# Patient Record
Sex: Female | Born: 1985 | Race: Black or African American | Hispanic: No | Marital: Single | Smoking: Never smoker
Health system: Southern US, Community
[De-identification: ages and names within clinical notes are randomized; demographics above are authoritative.]

## PROBLEM LIST (undated history)

## (undated) DIAGNOSIS — G5601 Carpal tunnel syndrome, right upper limb: Secondary | ICD-10-CM

## (undated) DIAGNOSIS — R011 Cardiac murmur, unspecified: Secondary | ICD-10-CM

## (undated) DIAGNOSIS — J40 Bronchitis, not specified as acute or chronic: Secondary | ICD-10-CM

## (undated) DIAGNOSIS — J45909 Unspecified asthma, uncomplicated: Secondary | ICD-10-CM

## (undated) HISTORY — PX: TUBAL LIGATION: SHX77

---
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
## 2005-01-01 ENCOUNTER — Inpatient Hospital Stay (HOSPITAL_COMMUNITY): Admission: AD | Admit: 2005-01-01 | Discharge: 2005-01-01 | Payer: Self-pay | Admitting: Obstetrics

## 2005-01-13 ENCOUNTER — Emergency Department (HOSPITAL_COMMUNITY): Admission: EM | Admit: 2005-01-13 | Discharge: 2005-01-13 | Payer: Self-pay | Admitting: Emergency Medicine

## 2005-01-16 ENCOUNTER — Inpatient Hospital Stay (HOSPITAL_COMMUNITY): Admission: AD | Admit: 2005-01-16 | Discharge: 2005-01-16 | Payer: Self-pay | Admitting: Obstetrics & Gynecology

## 2005-01-21 ENCOUNTER — Ambulatory Visit (HOSPITAL_COMMUNITY): Admission: RE | Admit: 2005-01-21 | Discharge: 2005-01-21 | Payer: Self-pay | Admitting: Obstetrics

## 2005-02-23 ENCOUNTER — Ambulatory Visit (HOSPITAL_COMMUNITY): Admission: RE | Admit: 2005-02-23 | Discharge: 2005-02-23 | Payer: Self-pay | Admitting: Obstetrics & Gynecology

## 2005-02-23 ENCOUNTER — Emergency Department (HOSPITAL_COMMUNITY): Admission: EM | Admit: 2005-02-23 | Discharge: 2005-02-23 | Payer: Self-pay | Admitting: Emergency Medicine

## 2005-02-26 ENCOUNTER — Encounter: Admission: RE | Admit: 2005-02-26 | Discharge: 2005-03-24 | Payer: Self-pay | Admitting: Obstetrics & Gynecology

## 2005-03-15 ENCOUNTER — Inpatient Hospital Stay (HOSPITAL_COMMUNITY): Admission: AD | Admit: 2005-03-15 | Discharge: 2005-03-15 | Payer: Self-pay | Admitting: Obstetrics & Gynecology

## 2005-03-31 ENCOUNTER — Inpatient Hospital Stay (HOSPITAL_COMMUNITY): Admission: AD | Admit: 2005-03-31 | Discharge: 2005-03-31 | Payer: Self-pay | Admitting: Obstetrics & Gynecology

## 2005-04-26 ENCOUNTER — Inpatient Hospital Stay (HOSPITAL_COMMUNITY): Admission: AD | Admit: 2005-04-26 | Discharge: 2005-04-26 | Payer: Self-pay | Admitting: Obstetrics

## 2005-05-02 ENCOUNTER — Inpatient Hospital Stay (HOSPITAL_COMMUNITY): Admission: AD | Admit: 2005-05-02 | Discharge: 2005-05-02 | Payer: Self-pay | Admitting: Obstetrics

## 2005-05-28 ENCOUNTER — Inpatient Hospital Stay (HOSPITAL_COMMUNITY): Admission: AD | Admit: 2005-05-28 | Discharge: 2005-05-28 | Payer: Self-pay | Admitting: Obstetrics & Gynecology

## 2005-06-01 ENCOUNTER — Inpatient Hospital Stay (HOSPITAL_COMMUNITY): Admission: AD | Admit: 2005-06-01 | Discharge: 2005-06-03 | Payer: Self-pay | Admitting: Obstetrics

## 2005-06-10 ENCOUNTER — Encounter: Admission: RE | Admit: 2005-06-10 | Discharge: 2005-06-10 | Payer: Self-pay | Admitting: Obstetrics & Gynecology

## 2005-06-23 ENCOUNTER — Emergency Department (HOSPITAL_COMMUNITY): Admission: EM | Admit: 2005-06-23 | Discharge: 2005-06-23 | Payer: Self-pay | Admitting: Emergency Medicine

## 2005-08-04 ENCOUNTER — Emergency Department (HOSPITAL_COMMUNITY): Admission: EM | Admit: 2005-08-04 | Discharge: 2005-08-04 | Payer: Self-pay | Admitting: Emergency Medicine

## 2005-10-04 ENCOUNTER — Emergency Department (HOSPITAL_COMMUNITY): Admission: EM | Admit: 2005-10-04 | Discharge: 2005-10-04 | Payer: Self-pay | Admitting: Emergency Medicine

## 2005-10-08 ENCOUNTER — Emergency Department (HOSPITAL_COMMUNITY): Admission: EM | Admit: 2005-10-08 | Discharge: 2005-10-08 | Payer: Self-pay | Admitting: Emergency Medicine

## 2005-11-18 ENCOUNTER — Inpatient Hospital Stay (HOSPITAL_COMMUNITY): Admission: AD | Admit: 2005-11-18 | Discharge: 2005-11-18 | Payer: Self-pay | Admitting: Family Medicine

## 2005-12-16 ENCOUNTER — Emergency Department (HOSPITAL_COMMUNITY): Admission: EM | Admit: 2005-12-16 | Discharge: 2005-12-16 | Payer: Self-pay | Admitting: Emergency Medicine

## 2005-12-21 ENCOUNTER — Ambulatory Visit: Payer: Self-pay | Admitting: Certified Nurse Midwife

## 2005-12-21 ENCOUNTER — Ambulatory Visit: Payer: Self-pay | Admitting: Obstetrics and Gynecology

## 2005-12-21 ENCOUNTER — Inpatient Hospital Stay (HOSPITAL_COMMUNITY): Admission: AD | Admit: 2005-12-21 | Discharge: 2005-12-24 | Payer: Self-pay | Admitting: Obstetrics and Gynecology

## 2005-12-23 ENCOUNTER — Ambulatory Visit: Payer: Self-pay | Admitting: Neonatology

## 2005-12-25 ENCOUNTER — Inpatient Hospital Stay (HOSPITAL_COMMUNITY): Admission: AD | Admit: 2005-12-25 | Discharge: 2005-12-26 | Payer: Self-pay | Admitting: Family Medicine

## 2005-12-27 ENCOUNTER — Ambulatory Visit: Payer: Self-pay | Admitting: Obstetrics & Gynecology

## 2006-01-04 ENCOUNTER — Inpatient Hospital Stay (HOSPITAL_COMMUNITY): Admission: AD | Admit: 2006-01-04 | Discharge: 2006-01-04 | Payer: Self-pay | Admitting: Obstetrics and Gynecology

## 2006-01-04 ENCOUNTER — Ambulatory Visit: Payer: Self-pay | Admitting: Obstetrics and Gynecology

## 2006-01-10 ENCOUNTER — Ambulatory Visit: Payer: Self-pay | Admitting: *Deleted

## 2006-01-21 ENCOUNTER — Inpatient Hospital Stay (HOSPITAL_COMMUNITY): Admission: AD | Admit: 2006-01-21 | Discharge: 2006-01-21 | Payer: Self-pay | Admitting: Obstetrics & Gynecology

## 2006-01-24 ENCOUNTER — Ambulatory Visit: Payer: Self-pay | Admitting: Obstetrics & Gynecology

## 2006-01-31 ENCOUNTER — Ambulatory Visit: Payer: Self-pay | Admitting: Obstetrics & Gynecology

## 2006-01-31 ENCOUNTER — Ambulatory Visit (HOSPITAL_COMMUNITY): Admission: RE | Admit: 2006-01-31 | Discharge: 2006-01-31 | Payer: Self-pay | Admitting: Obstetrics & Gynecology

## 2006-02-07 ENCOUNTER — Ambulatory Visit: Payer: Self-pay | Admitting: *Deleted

## 2006-02-28 ENCOUNTER — Ambulatory Visit: Payer: Self-pay | Admitting: Family Medicine

## 2006-03-06 ENCOUNTER — Inpatient Hospital Stay (HOSPITAL_COMMUNITY): Admission: AD | Admit: 2006-03-06 | Discharge: 2006-03-06 | Payer: Self-pay | Admitting: Obstetrics and Gynecology

## 2006-03-18 ENCOUNTER — Inpatient Hospital Stay (HOSPITAL_COMMUNITY): Admission: AD | Admit: 2006-03-18 | Discharge: 2006-03-19 | Payer: Self-pay | Admitting: Obstetrics and Gynecology

## 2006-03-18 ENCOUNTER — Ambulatory Visit: Payer: Self-pay | Admitting: Obstetrics and Gynecology

## 2006-04-06 ENCOUNTER — Ambulatory Visit: Payer: Self-pay | Admitting: Certified Nurse Midwife

## 2006-04-06 ENCOUNTER — Observation Stay (HOSPITAL_COMMUNITY): Admission: AD | Admit: 2006-04-06 | Discharge: 2006-04-07 | Payer: Self-pay | Admitting: Family Medicine

## 2006-04-23 ENCOUNTER — Ambulatory Visit: Payer: Self-pay | Admitting: *Deleted

## 2006-04-23 ENCOUNTER — Inpatient Hospital Stay (HOSPITAL_COMMUNITY): Admission: AD | Admit: 2006-04-23 | Discharge: 2006-04-23 | Payer: Self-pay | Admitting: Family Medicine

## 2006-04-25 ENCOUNTER — Inpatient Hospital Stay (HOSPITAL_COMMUNITY): Admission: AD | Admit: 2006-04-25 | Discharge: 2006-04-25 | Payer: Self-pay | Admitting: Gynecology

## 2006-04-25 ENCOUNTER — Ambulatory Visit: Payer: Self-pay | Admitting: Obstetrics and Gynecology

## 2006-04-26 ENCOUNTER — Inpatient Hospital Stay (HOSPITAL_COMMUNITY): Admission: AD | Admit: 2006-04-26 | Discharge: 2006-04-26 | Payer: Self-pay | Admitting: Obstetrics and Gynecology

## 2006-04-26 ENCOUNTER — Ambulatory Visit: Payer: Self-pay | Admitting: *Deleted

## 2006-05-05 ENCOUNTER — Inpatient Hospital Stay (HOSPITAL_COMMUNITY): Admission: AD | Admit: 2006-05-05 | Discharge: 2006-05-07 | Payer: Self-pay | Admitting: Obstetrics and Gynecology

## 2006-05-05 ENCOUNTER — Ambulatory Visit: Payer: Self-pay | Admitting: Obstetrics and Gynecology

## 2006-08-31 ENCOUNTER — Emergency Department (HOSPITAL_COMMUNITY): Admission: EM | Admit: 2006-08-31 | Discharge: 2006-08-31 | Payer: Self-pay | Admitting: Emergency Medicine

## 2006-11-29 ENCOUNTER — Emergency Department (HOSPITAL_COMMUNITY): Admission: EM | Admit: 2006-11-29 | Discharge: 2006-11-29 | Payer: Self-pay | Admitting: Emergency Medicine

## 2007-01-23 ENCOUNTER — Emergency Department (HOSPITAL_COMMUNITY): Admission: EM | Admit: 2007-01-23 | Discharge: 2007-01-23 | Payer: Self-pay | Admitting: Emergency Medicine

## 2007-01-25 ENCOUNTER — Ambulatory Visit: Payer: Self-pay | Admitting: Obstetrics and Gynecology

## 2007-02-28 ENCOUNTER — Ambulatory Visit (HOSPITAL_COMMUNITY): Admission: RE | Admit: 2007-02-28 | Discharge: 2007-02-28 | Payer: Self-pay | Admitting: Obstetrics & Gynecology

## 2007-02-28 ENCOUNTER — Ambulatory Visit: Payer: Self-pay | Admitting: Obstetrics & Gynecology

## 2007-03-01 ENCOUNTER — Inpatient Hospital Stay (HOSPITAL_COMMUNITY): Admission: AD | Admit: 2007-03-01 | Discharge: 2007-03-01 | Payer: Self-pay | Admitting: Family Medicine

## 2007-03-14 ENCOUNTER — Emergency Department (HOSPITAL_COMMUNITY): Admission: EM | Admit: 2007-03-14 | Discharge: 2007-03-14 | Payer: Self-pay | Admitting: Emergency Medicine

## 2007-03-18 ENCOUNTER — Emergency Department (HOSPITAL_COMMUNITY): Admission: EM | Admit: 2007-03-18 | Discharge: 2007-03-18 | Payer: Self-pay | Admitting: Emergency Medicine

## 2007-06-15 ENCOUNTER — Emergency Department (HOSPITAL_COMMUNITY): Admission: EM | Admit: 2007-06-15 | Discharge: 2007-06-15 | Payer: Self-pay | Admitting: Emergency Medicine

## 2007-06-28 ENCOUNTER — Emergency Department (HOSPITAL_COMMUNITY): Admission: EM | Admit: 2007-06-28 | Discharge: 2007-06-29 | Payer: Self-pay | Admitting: Emergency Medicine

## 2007-08-07 ENCOUNTER — Inpatient Hospital Stay (HOSPITAL_COMMUNITY): Admission: AD | Admit: 2007-08-07 | Discharge: 2007-08-07 | Payer: Self-pay | Admitting: Obstetrics & Gynecology

## 2007-09-26 ENCOUNTER — Emergency Department (HOSPITAL_COMMUNITY): Admission: EM | Admit: 2007-09-26 | Discharge: 2007-09-26 | Payer: Self-pay | Admitting: Emergency Medicine

## 2007-09-29 ENCOUNTER — Emergency Department (HOSPITAL_COMMUNITY): Admission: EM | Admit: 2007-09-29 | Discharge: 2007-09-29 | Payer: Self-pay | Admitting: Emergency Medicine

## 2007-10-09 ENCOUNTER — Emergency Department (HOSPITAL_COMMUNITY): Admission: EM | Admit: 2007-10-09 | Discharge: 2007-10-09 | Payer: Self-pay | Admitting: Emergency Medicine

## 2007-10-20 ENCOUNTER — Emergency Department (HOSPITAL_COMMUNITY): Admission: EM | Admit: 2007-10-20 | Discharge: 2007-10-20 | Payer: Self-pay | Admitting: Emergency Medicine

## 2007-11-21 ENCOUNTER — Inpatient Hospital Stay (HOSPITAL_COMMUNITY): Admission: AD | Admit: 2007-11-21 | Discharge: 2007-11-21 | Payer: Self-pay | Admitting: Obstetrics & Gynecology

## 2007-12-11 ENCOUNTER — Emergency Department (HOSPITAL_COMMUNITY): Admission: EM | Admit: 2007-12-11 | Discharge: 2007-12-11 | Payer: Self-pay | Admitting: Emergency Medicine

## 2007-12-22 ENCOUNTER — Emergency Department (HOSPITAL_COMMUNITY): Admission: EM | Admit: 2007-12-22 | Discharge: 2007-12-22 | Payer: Self-pay | Admitting: Emergency Medicine

## 2008-01-09 ENCOUNTER — Emergency Department (HOSPITAL_COMMUNITY): Admission: EM | Admit: 2008-01-09 | Discharge: 2008-01-09 | Payer: Self-pay | Admitting: Emergency Medicine

## 2008-02-04 ENCOUNTER — Inpatient Hospital Stay (HOSPITAL_COMMUNITY): Admission: AD | Admit: 2008-02-04 | Discharge: 2008-02-04 | Payer: Self-pay | Admitting: Gynecology

## 2008-03-24 ENCOUNTER — Emergency Department (HOSPITAL_COMMUNITY): Admission: EM | Admit: 2008-03-24 | Discharge: 2008-03-24 | Payer: Self-pay | Admitting: Emergency Medicine

## 2008-04-14 ENCOUNTER — Emergency Department (HOSPITAL_COMMUNITY): Admission: EM | Admit: 2008-04-14 | Discharge: 2008-04-14 | Payer: Self-pay | Admitting: Emergency Medicine

## 2008-04-18 ENCOUNTER — Emergency Department (HOSPITAL_COMMUNITY): Admission: EM | Admit: 2008-04-18 | Discharge: 2008-04-18 | Payer: Self-pay | Admitting: Emergency Medicine

## 2008-06-03 ENCOUNTER — Inpatient Hospital Stay (HOSPITAL_COMMUNITY): Admission: AD | Admit: 2008-06-03 | Discharge: 2008-06-03 | Payer: Self-pay | Admitting: Obstetrics & Gynecology

## 2008-07-30 ENCOUNTER — Emergency Department (HOSPITAL_COMMUNITY): Admission: EM | Admit: 2008-07-30 | Discharge: 2008-07-30 | Payer: Self-pay | Admitting: Emergency Medicine

## 2008-09-07 IMAGING — CT CT PELVIS W/ CM
3 of 5 series · 14 of 32 positions shown, 19 images · IV contrast (omnipaque)
Comparison: none

CLINICAL DATA: Right-sided abdominal and pelvic pain.  Nausea/vomiting.
 ABDOMEN CT WITH CONTRAST:
TECHNIQUE: Multidetector CT imaging of the abdomen was performed following the standard protocol during bolus administration of intravenous contrast.
 Contrast:  100 cc Omnipaque 300 and oral contrast.
TECHNIQUE: Multidetector CT imaging of the pelvis was performed following the standard protocol during bolus administration of intravenous contrast.

[Series 2: routine abdomen · axial · 0.70mm/px · z∈[-389,-144]mm · 4 of 83 slices shown, 9 images]
[im 17/83  soft-tissue]
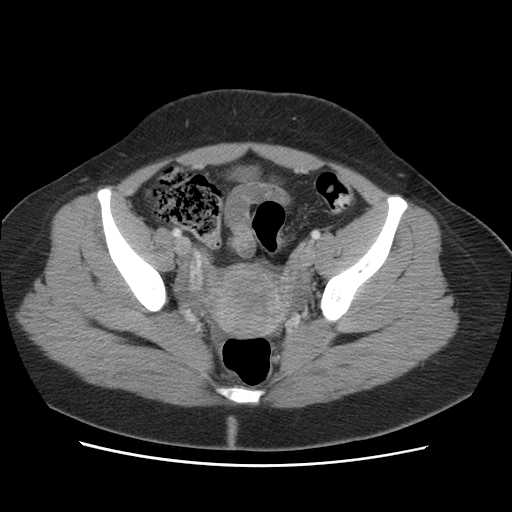
[im 17/83  lung]
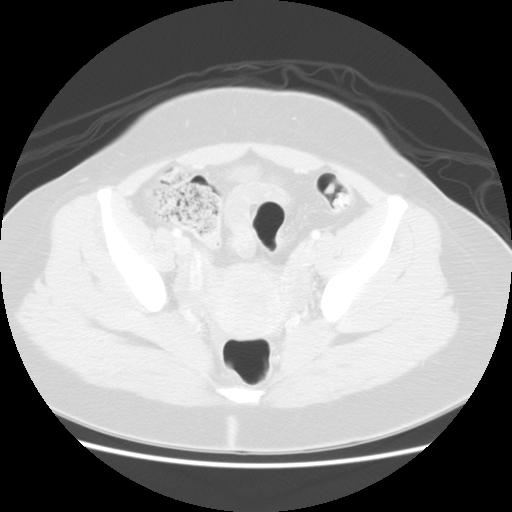
[im 17/83  bone]
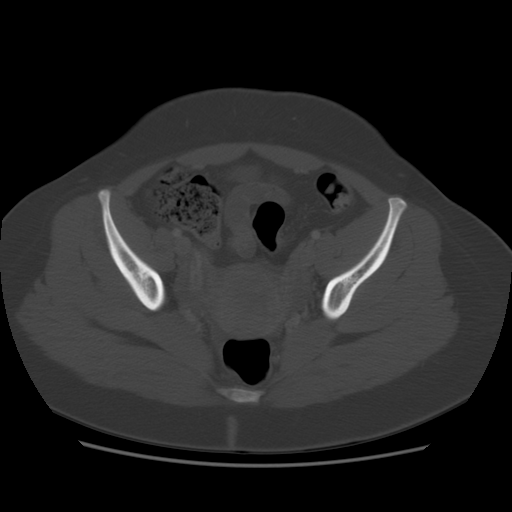
[im 33/83  soft-tissue]
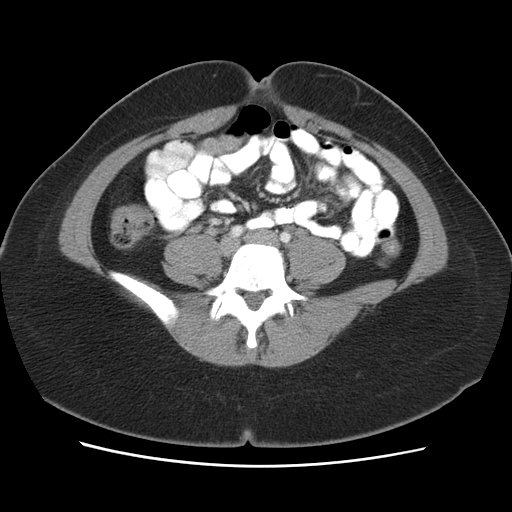
[im 33/83  lung]
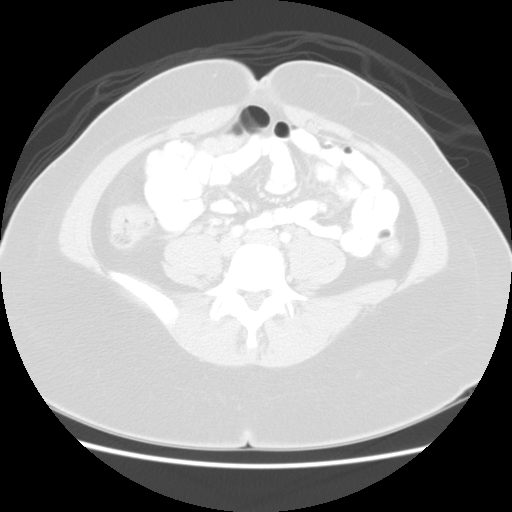
[im 50/83  soft-tissue]
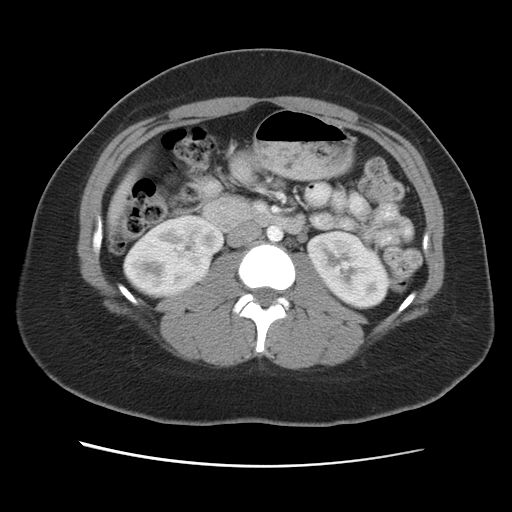
[im 50/83  lung]
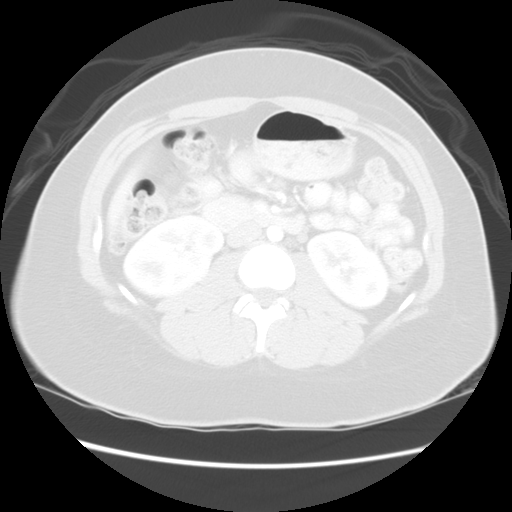
[im 66/83  soft-tissue]
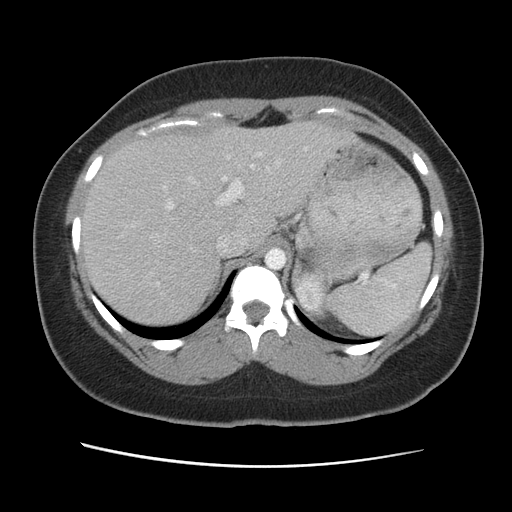
[im 66/83  lung]
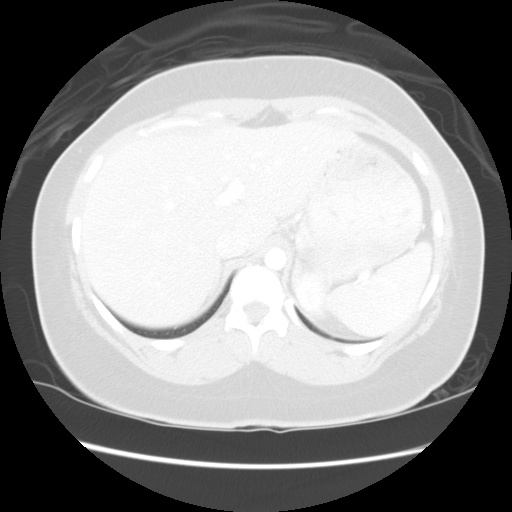

[Series 400: reformatted · sagittal · 0.88mm/px · 8 of 168 slices shown (1 of 2)]
[im 16/168  soft-tissue]
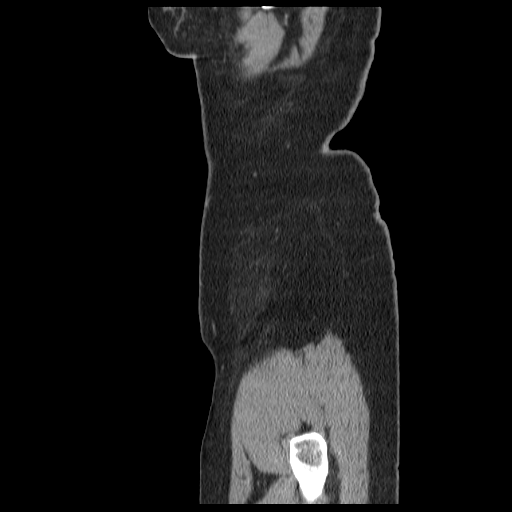
[im 31/168  soft-tissue]
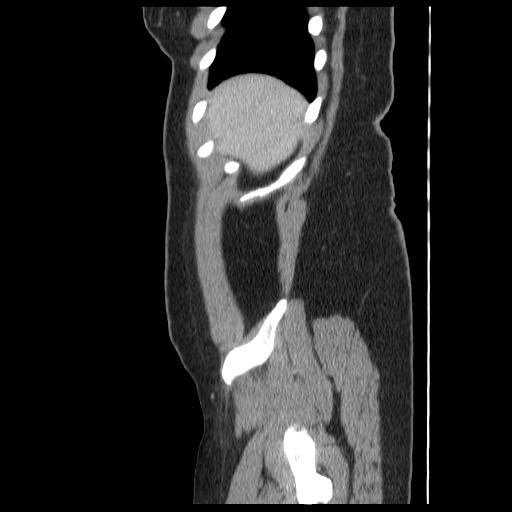
[im 61/168  soft-tissue]
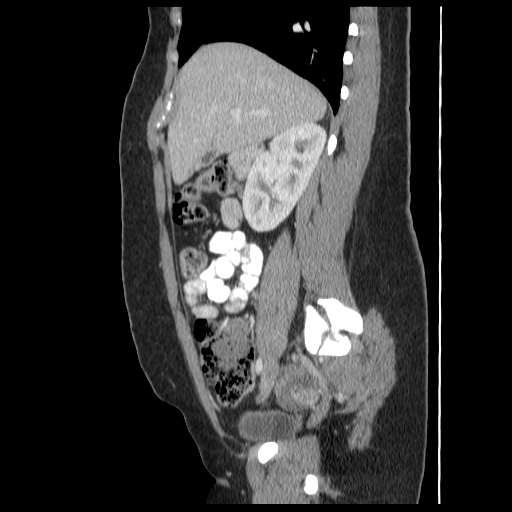
[im 76/168  soft-tissue]
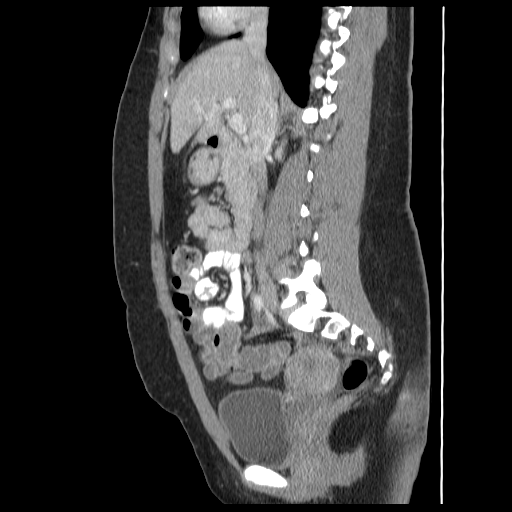
[im 92/168  soft-tissue]
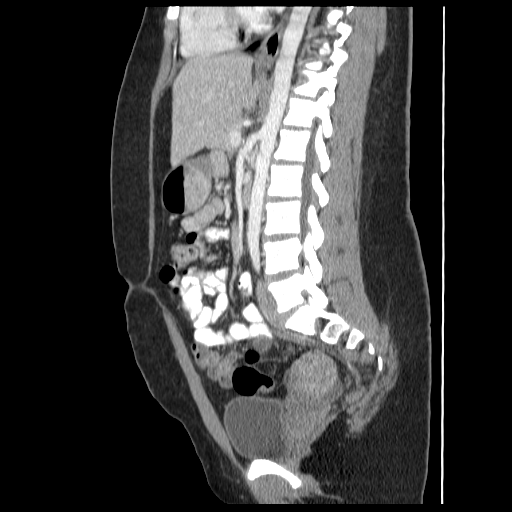
[im 107/168  soft-tissue]
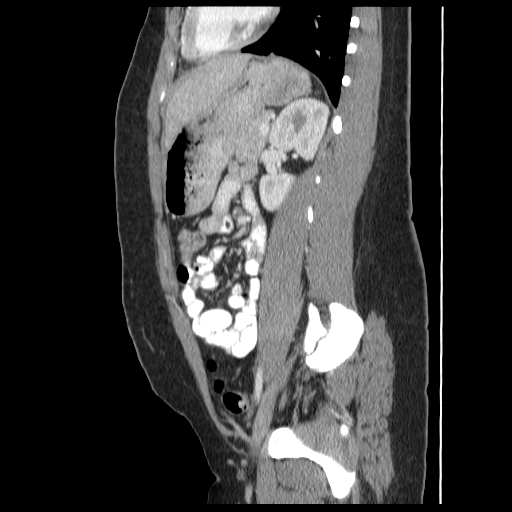
[im 137/168  soft-tissue]
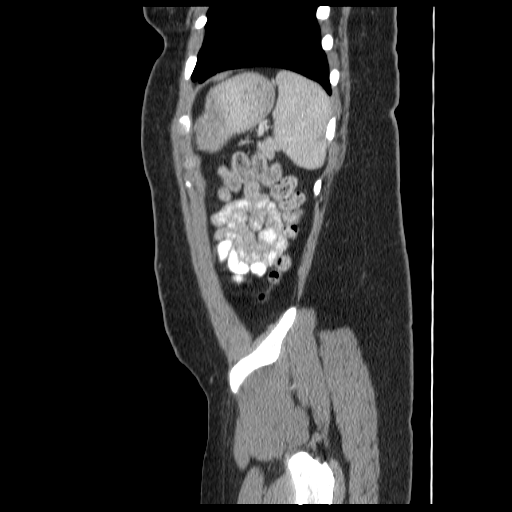
[im 152/168  soft-tissue]
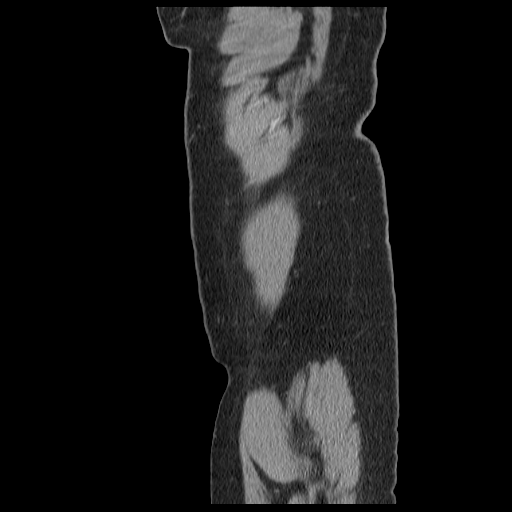

[Series 401: reformatted · coronal · 0.88mm/px · 2 of 140 slices shown (2 of 2)]
[im 16/140  soft-tissue]
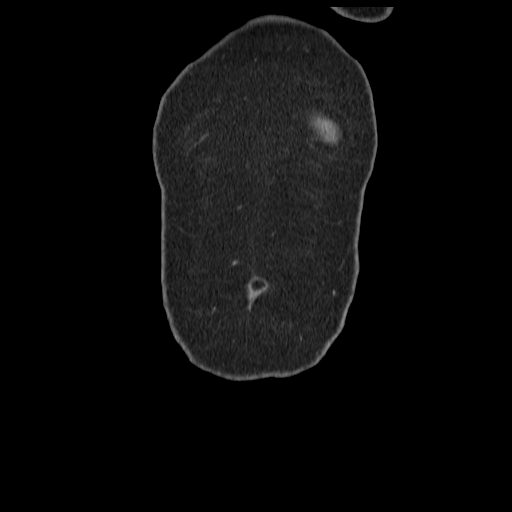
[im 31/140  soft-tissue]
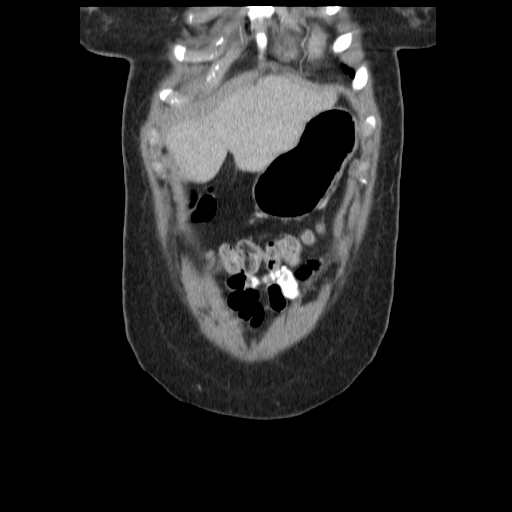

[14 of 32 positions shown; findings below may reference images not displayed]

FINDINGS: The abdominal parenchymal organs are normal in appearance.  A 4 mm calculus is noted in the lower pole of the right kidney, however there is no evidence of hydronephrosis.  There is no evidence of mass or lymphadenopathy within the abdomen.  There is no evidence of inflammatory process or abnormal fluid collections.
IMPRESSION: 1.  No acute findings.
 2.  4 mm right lower pole intrarenal calculus.  No evidence of hydronephrosis.
 PELVIS CT WITH CONTRAST:
FINDINGS: There is no evidence of pelvic mass or adenopathy.  There is no evidence of inflammatory process.  Normal appendix is noted.  There is a tiny amount of free fluid in the pelvic cul-de-sac which is nonspecific but likely physiologic.
IMPRESSION: 1.  Tiny amount of free fluid in the pelvic cul-de-sac which is nonspecific but likely physiologic.
 2.  No evidence of mass or inflammatory process.

## 2009-06-14 ENCOUNTER — Emergency Department (HOSPITAL_COMMUNITY): Admission: EM | Admit: 2009-06-14 | Discharge: 2009-06-14 | Payer: Self-pay | Admitting: Emergency Medicine

## 2009-11-22 ENCOUNTER — Emergency Department (HOSPITAL_COMMUNITY): Admission: EM | Admit: 2009-11-22 | Discharge: 2009-11-22 | Payer: Self-pay | Admitting: Emergency Medicine

## 2010-08-02 ENCOUNTER — Encounter: Payer: Self-pay | Admitting: Obstetrics & Gynecology

## 2010-08-02 ENCOUNTER — Encounter: Payer: Self-pay | Admitting: Otolaryngology

## 2010-09-29 LAB — COMPREHENSIVE METABOLIC PANEL
Albumin: 3.8 g/dL (ref 3.5–5.2)
BUN: 7 mg/dL (ref 6–23)
CO2: 24 mEq/L (ref 19–32)
Calcium: 8.7 mg/dL (ref 8.4–10.5)
Creatinine, Ser: 0.66 mg/dL (ref 0.4–1.2)
GFR calc Af Amer: >60 mL/min (ref >60)
GFR calc non Af Amer: >60 mL/min (ref >60)
Glucose, Bld: 87 mg/dL (ref 70–99)

## 2010-09-29 LAB — URINALYSIS, ROUTINE W REFLEX MICROSCOPIC
Glucose, UA: NEGATIVE mg/dL
Ketones, ur: NEGATIVE mg/dL
Leukocytes, UA: NEGATIVE
Nitrite: NEGATIVE
Specific Gravity, Urine: 1.035 — ABNORMAL HIGH (ref 1.005–1.030)
Urobilinogen, UA: 0.2 mg/dL (ref 0.0–1.0)

## 2010-09-29 LAB — POCT PREGNANCY, URINE: Preg Test, Ur: NEGATIVE

## 2010-10-13 LAB — URINALYSIS, ROUTINE W REFLEX MICROSCOPIC
Bilirubin Urine: NEGATIVE
Leukocytes, UA: NEGATIVE
Nitrite: NEGATIVE
Protein, ur: NEGATIVE mg/dL
Specific Gravity, Urine: 1.025 (ref 1.005–1.030)

## 2010-10-13 LAB — POCT PREGNANCY, URINE: Preg Test, Ur: NEGATIVE

## 2010-10-13 LAB — URINE MICROSCOPIC-ADD ON

## 2010-10-13 LAB — WET PREP, GENITAL
Trich, Wet Prep: NONE SEEN
Yeast Wet Prep HPF POC: NONE SEEN

## 2010-10-26 LAB — URINALYSIS, ROUTINE W REFLEX MICROSCOPIC
Glucose, UA: NEGATIVE mg/dL
Ketones, ur: NEGATIVE mg/dL
Nitrite: NEGATIVE
Protein, ur: NEGATIVE mg/dL
pH: 8 (ref 5.0–8.0)

## 2010-10-26 LAB — POCT I-STAT, CHEM 8
BUN: 10 mg/dL (ref 6–23)
Chloride: 103 mEq/L (ref 96–112)
Creatinine, Ser: 0.8 mg/dL (ref 0.4–1.2)
Potassium: 4 mEq/L (ref 3.5–5.1)
Sodium: 139 mEq/L (ref 135–145)

## 2010-10-26 LAB — DIFFERENTIAL
Basophils Absolute: 0 10*3/uL (ref 0.0–0.1)
Lymphocytes Relative: 10 % — ABNORMAL LOW (ref 12–46)
Lymphs Abs: 0.6 10*3/uL — ABNORMAL LOW (ref 0.7–4.0)
Neutro Abs: 5 10*3/uL (ref 1.7–7.7)

## 2010-10-26 LAB — CBC
Platelets: 309 10*3/uL (ref 150–400)
RDW: 15.9 % — ABNORMAL HIGH (ref 11.5–15.5)
WBC: 5.9 10*3/uL (ref 4.0–10.5)

## 2010-11-24 NOTE — Op Note (Signed)
NAME:  Ashley Brown, Ashley Brown                ACCOUNT NO.:  [id number]   MEDICAL RECORD NO.:  [id number]          PATIENT TYPE:  AMB   LOCATION:  SDC                           [facility]ACILITY:  [facility]   PHYSICIAN:  Allie Bossier, MD        DATE OF BIRTH:  [birth year]   DATE OF PROCEDURE:  DATE OF DISCHARGE:                               OPERATIVE REPORT   PREOPERATIVE DIAGNOSES:  Multiparity, desires sterility.   POSTOPERATIVE DIAGNOSES:  Multiparity, desires sterility.   PROCEDURE:  Laparoscopic application of Filshie clips.   SURGEON:  Allie Bossier, MD.   ANESTHESIADorena Bodo, Raul Del, M.D.   COMPLICATIONS:  None.   ESTIMATED BLOOD LOSS:  Minimal.   SPECIMENS:  None.   FINDINGS:  Normal pelvis.   DESCRIPTION OF PROCEDURE AND FINDINGS:  In the preoperative area, the  risks, benefits and alternatives of surgery were explained to her and  accepted. She understands the 1% failure rate as well as the permanence  of the procedure. She declines alternative forms of birth control and  wishes to have this procedure done. In the operating room, she was  placed in the dorsal lithotomy position, general anesthesia was applied  without complication. Her abdomen and vagina were prepped and draped in  the usual sterile fashion. A Robinson catheter was used to drain her  bladder. Bimanual exam was done, a normal size and shape, anteverted,  mobile uterus was appreciated with no adnexal enlargement. A Hulka  manipulator was placed, gloves were changed, attention was turned to the  abdomen. A vertical umbilical incision was made, a Veress needle was  placed intraperitoneally, low flow CO2 was used to insufflate the  abdomen to approximately 4 liters. The patient's abdominal pressure was  always less than 5. Due to the distensibility of her skin and abdomen  presumably prior to so many pregnancies, a total of 4 liters was  required to obtain a satisfactory pneumoperitoneum. A 12 mm trocar was  placed. The  patient was in Trendelenburg position. Laparoscopy confirmed  correct placement. The pelvis was inspected, no abnormalities were  noted. The oviducts were each visually traced to the fimbriated end. In  the isthmic region of each oviduct, a Filshie clamp was placed across  the entire oviduct. No bleeding was noted. The CO2 was allowed to escape  from the abdomen, the trocar was  removed, the fascia was elevated with Kocher clamps and closed with a #0  Vicryl running and locking suture. A subcuticular closure was done with  4-0 Vicryl suture. She was extubated and taken to the recovery room in  stable condition with instrument, sponge and needle counts correct.      Allie Bossier, MD  Electronically Signed     MCD/MEDQ  D:  02/28/2007  T:  03/01/2007  Job:  [id number]

## 2010-11-27 NOTE — Discharge Summary (Signed)
NAME:  Ashley Brown, Ashley Brown                          ACCOUNT NO.:  [id number]   MEDICAL RECORD NO.:  [id number]                   PATIENT TYPE:  INP   LOCATION:  9154                                 FACILITY:  [facility]   PHYSICIAN:  Roseanna Rainbow, M.D.         DATE OF BIRTH:  23-Nov-1985   DATE OF ADMISSION:  12/11/2003  DATE OF DISCHARGE:  01/03/2004                                 DISCHARGE SUMMARY   CHIEF COMPLAINT:  The patient is an 25 year old gravida 2 para 1 who  presents with an intrauterine pregnancy at 28 weeks with uterine  contractions.   HISTORY OF PRESENT ILLNESS:  The patient reported contractions for  approximately 12 hours prior to presentation.  She denies ruptured  membranes, vaginal bleeding.  She reports good fetal activity.  Source of  care:  [facility].  Pregnancy complications or risks:  Adolescence, history of a  preterm delivery, and brief interpregnancy interval.   ALLERGIES:  No known drug allergies.   PAST OBSTETRICAL AND GYNECOLOGICAL HISTORY:  See above.  She has history of  Trichomonas.   PAST MEDICAL HISTORY:  She denies.   FAMILY HISTORY:  Remarkable for hypothyroidism.   SOCIAL HISTORY:  She denies any tobacco, ethanol, or substance abuse.   MEDICATIONS:  Prenatal vitamins.   PHYSICAL EXAMINATION:  VITAL SIGNS:  Stable, afebrile.  Fetal heart tracing  reassuring.  Tocodynamometer:  Contractions every 2-4 minutes.  PELVIC:  Digital cervical exam 3 and 50% in the office.   ASSESSMENT:  Intrauterine pregnancy at 28 weeks with preterm labor.   PLAN:  Admission, tocolysis, broad-spectrum parenteral antibiotics, and  betamethasone.   HOSPITAL COURSE:  The patient was admitted and was started on gentamycin and  clindamycin.  She was also started on intravenous magnesium sulfate and she  received a course of betamethasone.  She was tocolyzed with the magnesium  sulfate and this was subsequently discontinued.  An ultrasound revealed  appropriate growth  and cervical length of 3 cm and a normal amniotic fluid  index.  She was noted to have baseline borderline tachycardia.  EKG  demonstrated sinus tachycardia.  Hemoglobin was 9.8.  On exam prior to  discharge her cervix was felt to be thick and approximately a fingertip.   DISCHARGE DIAGNOSES:  1. Intrauterine pregnancy at 30+ weeks.  2. Threatened preterm labor.   CONDITION:  Stable.   DIET:  Regular.   ACTIVITY:  Bedrest.   DISPOSITION:  The patient has a follow-up appointment on January 07, 2004 at  2:15 p.m. in the office.                                               Roseanna Rainbow, M.D.    Ashley Brown  D:  01/03/2004  T:  01/04/2004  Job:  [phone]

## 2010-11-27 NOTE — Discharge Summary (Signed)
NAMEHARSHIKA, MAGO                ACCOUNT NO.:  [id number]   MEDICAL RECORD NO.:  [id number]          PATIENT TYPE:  INP   LOCATION:  9313                          [facility]ACILITY:  [facility]   PHYSICIAN:  Tracy L. Mayford Knife, M.D.DATE OF BIRTH:  June 03, 1986   DATE OF ADMISSION:  12/21/2005  DATE OF DISCHARGE:  12/24/2005                                 DISCHARGE SUMMARY   REASON FOR CONSULTATION:  PPROM.   DISCHARGE DIAGNOSES:  1.  Prolonged premature rupture of membranes.  2.  History of incompetent cervix with three previous cerclages.   IMAGING:  Ultrasound on December 21, 2005 showed a single fetus with a vertical  pocket of 3.6 cm.  Mean estimated gestational age [redacted] weeks 6 days with [facility]  of May 18, 2006.  Cervix was 3.3 cm and closed.  Repeat ultrasound on  December 24, 2005 showed a vertical pocket of 5 cm, cervix of 3 cm.   HISTORY OF PRESENT ILLNESS:  The patient is a 25 year old G4, P1-2-0-3 at 19  weeks 2 days dated by LMP and 18 weeks 6 days dated by an ultrasound who  presented with leakage of fluid.  She was found to be ruptured.  Her initial  vertical pocket was 3.6 cm.  The patient was admitted and started on IV  antibiotics and was ultimately changed to p.o. antibiotics.  Cultures were  done.  Gonorrhea and chlamydia were negative.  Wet prep was noted to have  yeast so she was treated with Diflucan.  During her hospitalization, the  leakage of fluid did slow down.  Perinatology was consulted.  They  recommended repeating another ultrasound.  __________ vertical fluid pocket  could be established.  The patient was counseled and wanted to be discharged  to home on bedrest and to follow up weekly in the office and to have her AFI  checked.  She is to monitor her temperatures and present for any signs of  labor.  __________ were done in the hospital.   DISPOSITION:  Home.   FOLLOWUP:  On Monday in the high risk clinic for AFI.   DISCHARGE INSTRUCTIONS:  Nothing per vagina.  Strict  bedrest.   DIET:  Regular.           ______________________________  Marc Morgans. Mayford Knife, M.D.     TLW/MEDQ  D:  12/24/2005  T:  12/25/2005  Job:  [id number]

## 2010-11-27 NOTE — Discharge Summary (Signed)
NAMEDODIE, PARISI                ACCOUNT NO.:  [id number]   MEDICAL RECORD NO.:  [id number]          PATIENT TYPE:  INP   LOCATION:  9305                          [facility]ILITY[facility]   PHYSICIAN:  Roseanna Rainbow, M.D.DATE OF BIRTH:  Nov 11, 1985   DATE OF ADMISSION:  12/17/2004  DATE OF DISCHARGE:  12/22/2004                                 DISCHARGE SUMMARY   CHIEF COMPLAINT:  The patient presents with a 15 week intrauterine  pregnancy, complaining of cramping x12 hours.   HISTORY OF PRESENT ILLNESS:  The patient is a G3, P2 with a history of two  preterm deliveries.  Her last menstrual period was September 01, 2004 and the  estimated gestational age was 41 3/7 weeks at the time of admission.  The  patient presented with a complaint of lower abdominal cramping.  She was  recently treated for Chlamydia cervicitis one month ago.   PAST MEDICAL HISTORY:  Noncontributory.   PAST GYN HISTORY:  Chlamydia, Trichomonas.   PHYSICAL EXAM:  VSS  Fetal heart rates were Dopplered at 150  Pelvic: Uterus gravid/NT   ASSESSMENT/PLAN:  IUP at 15 weeks, cervicitis, r/o endometritis.   Admit.  Parenteral antibiotics.   HOSPITAL COURSE:  The patient was admitted per Antionette Char, MD and was given a course  of IV antibiotics.  Her hospital course was uneventful.  The patient was  discharged home on hospital day #5.  Diet and activity were as tolerated.  The patient was sent home with a prescription for Fioricet p.r.n. as needed  for headaches and was instructed to follow up in our office.      Maylon Cos, C.N.M.      Roseanna Rainbow, M.D.  Electronically Signed   SS/MEDQ  D:  02/11/2005  T:  02/12/2005  Job:  [id number]

## 2010-11-27 NOTE — Discharge Summary (Signed)
NAMEKASHISH, YGLESIAS                ACCOUNT NO.:  [id number]   MEDICAL RECORD NO.:  [id number]          PATIENT TYPE:  OBV   LOCATION:  9153                          [facility]ILITY:  [facility]   PHYSICIAN:  Lesly Dukes, M.D. DATE OF BIRTH:  1986/05/31   DATE OF ADMISSION:  04/06/2006  DATE OF DISCHARGE:  04/07/2006                                 DISCHARGE SUMMARY   REASON FOR ADMISSION:  Mild oligohydramnios on ultrasound with question of  PPROM.   DISCHARGE DIAGNOSES:  1. Oligohydramnios, resolved.  2. A 32 week intrauterine pregnancy.  3. Bacterial vaginosis.   ULTRASOUND:  Admit AFI 5.2.  Repeat AFI 11.2.   ADMISSION LABS:  Wet prep showed clue cells.   HOSPITAL COURSE:  Patient is a 25 year old G61, P1, 2-0-3 at 90 weeks dated  by an 18 week ultrasound that gives her an [facility] of May 18, 2006.  Patient  has a history of PPROM at approximately 19 weeks that was thought to have  refilled.  Patient presented complaining of leakage of fluid.  Sterile  speculum exam was performed.  There was no pooling, no ferning.  AFI was  obtained, found to be low at 5.2.  Patient was put in overnight for  observation.  Repeat AFI showed AFI of 11.2.  A repeat sterile speculum exam  was done, was negative for fern, pooling, and Nitrazine.  Patient was also  found to have bacterial vaginosis.  She was counseled that the vaginal  discharge she is having is secondary to that.   DISPOSITION:  Home in stable condition.   Follow up in OB clinic in one week.   DISCHARGE MEDICATIONS:  1. Metronidazole 1 tab p.o. t.i.d. x7 days.  2. Prenatal vitamins.   ACTIVITY:  Regular.   DIET:  Regular.    ______________________________  Marc Morgans. Mayford Knife, M.D.    ______________________________  Lesly Dukes, M.D.   TLW/MEDQ  D:  04/08/2006  T:  04/09/2006  Job:  [id number]

## 2010-11-27 NOTE — Discharge Summary (Signed)
NAME:  Ashley Brown, Ashley Brown                          ACCOUNT NO.:  [id number]   MEDICAL RECORD NO.:  [id number]                   PATIENT TYPE:  INP   LOCATION:  9149                                 [facility]   PHYSICIAN:  Roseanna Rainbow, M.D.         DATE OF BIRTH:  Sep 19, 1985   DATE OF ADMISSION:  01/15/2004  DATE OF DISCHARGE:  01/26/2004                                 DISCHARGE SUMMARY   CHIEF COMPLAINT:  The patient is an 25 year old gravida 2 para 0-1-0-1 who  presents at 31 and two-sevenths weeks complaining of leakage of fluid.   HISTORY OF PRESENT ILLNESS:  See above.  The patient noted leakage of fluid  for 1 day prior to presentation.  She reported good fetal activity.  She  also complained of uterine contractions.   PRENATAL COURSE:  Source of care:  [facility] with onset of care at 9 weeks.  Pregnancy complications or risks:  History of preterm labor and a preterm  delivery at 35 weeks with preterm premature rupture of membranes.  Brief  interval in-between pregnancies.   MEDICATIONS:  Prenatal vitamins.   ALLERGIES:  No known drug allergies.   PAST OBSTETRICAL HISTORY:  See above.   PAST GYNECOLOGICAL HISTORY:  History of trichomoniasis.   PAST MEDICAL HISTORY:  Anemia.   PAST SURGICAL HISTORY:  She denies.   FAMILY AND SOCIAL HISTORY:  She is single.   PRENATAL LABORATORY DATA:  Blood type and Rh AB positive, antibody screen  negative.  Hemoglobin 12.6; platelets 332,000.  Rubella nonimmune.  Hepatitis B surface antigen negative.  Syphilis nonreactive.  HIV  nonreactive.  GC negative, chlamydia negative.   PHYSICAL EXAMINATION:  VITAL SIGNS:  Temperature 98.4, pulse 106,  respiratory rate 20, blood pressure 130/76.  GENERAL:  Pleasant, no apparent distress.  ABDOMEN:  Gravid, nontender.  EXTREMITIES:  No edema.  PELVIC:  Speculum exam:  Small pooling of white-yellowish discharge, ferning  noted.  Wet prep, GBS sent.  The cervix appeared 2 cm long and  2 cm dilated  on speculum exam.   Fetal heart rate:  Baseline 140s to 150s with average long-term variability,  reactive, no decelerations.  Tocodynamometer:  No contractions.  Ultrasound  at the bedside, informal:  Cephalic presentation, subjectively adequate  fluid.   IMPRESSION:  Intrauterine pregnancy at 32+ weeks with rule out preterm  premature rupture of membranes.   PLAN:  Admission, parenteral antibiotics, expectant management.   HOSPITAL COURSE:  The patient was subsequently admitted and started on  intravenous clindamycin and bedrest.  She complained of persistent leakage  of fluid.  A repeat speculum exam on January 25, 2004:  No pooling was noted.  There was no efflux of fluid through the external os with Valsalva.  The  discharge was fern and nitrazine negative.  Serial amniotic fluid index  reports demonstrated a normal amniotic fluid index with an initial AFI of  17; subsequent AFI  2 days later was 12.6; amniotic fluid index subsequent to  the 12.6 was 12.4 on the date of discharge.   DISCHARGE DIAGNOSES:  1.  Intrauterine pregnancy at 33+ weeks.  2.  Unlikely preterm premature rupture of membranes.  3.  Threatened preterm labor.   CONDITION:  Stable   DIET:  Regular.   ACTIVITY:  Modified bedrest.   MEDICATIONS:  Resume home medications.   DISPOSITION:  The patient was to follow up in the office in 2 days.                                               Roseanna Rainbow, M.D.    Judee Clara  D:  03/12/2004  T:  03/14/2004  Job:  [id number]

## 2011-04-01 LAB — DIFFERENTIAL
Basophils Absolute: 0
Lymphocytes Relative: 44
Monocytes Relative: 20 — ABNORMAL HIGH
Neutro Abs: 1.4 — ABNORMAL LOW

## 2011-04-01 LAB — URINALYSIS, ROUTINE W REFLEX MICROSCOPIC
Glucose, UA: NEGATIVE
Leukocytes, UA: NEGATIVE
Protein, ur: NEGATIVE
Specific Gravity, Urine: >1.03 — ABNORMAL HIGH
pH: 6

## 2011-04-01 LAB — CBC
Hemoglobin: 10.5 — ABNORMAL LOW
Platelets: 349
RDW: 17.8 — ABNORMAL HIGH
WBC: 4.1

## 2011-04-01 LAB — URINE MICROSCOPIC-ADD ON

## 2011-04-01 LAB — WET PREP, GENITAL: Trich, Wet Prep: NONE SEEN

## 2011-04-01 LAB — GC/CHLAMYDIA PROBE AMP, GENITAL
Chlamydia, DNA Probe: NEGATIVE
GC Probe Amp, Genital: NEGATIVE

## 2011-04-05 LAB — DIFFERENTIAL
Basophils Relative: 1
Lymphs Abs: 2.4
Monocytes Relative: 5
Neutro Abs: 2.1
Neutrophils Relative %: 43

## 2011-04-05 LAB — COMPREHENSIVE METABOLIC PANEL
Alkaline Phosphatase: 65
BUN: 6
Calcium: 9.1
Glucose, Bld: 98
Total Protein: 7.2

## 2011-04-05 LAB — CBC
HCT: 33.2 — ABNORMAL LOW
Hemoglobin: 11.1 — ABNORMAL LOW
MCHC: 33.6
MCV: 77.9 — ABNORMAL LOW
RDW: 17.5 — ABNORMAL HIGH

## 2011-04-05 LAB — WET PREP, GENITAL

## 2011-04-05 LAB — RPR: RPR Ser Ql: NONREACTIVE

## 2011-04-05 LAB — GC/CHLAMYDIA PROBE AMP, GENITAL: Chlamydia, DNA Probe: NEGATIVE

## 2011-04-05 LAB — URINALYSIS, ROUTINE W REFLEX MICROSCOPIC
Nitrite: NEGATIVE
Specific Gravity, Urine: 1.016
pH: 5.5

## 2011-04-05 LAB — URINE CULTURE: Colony Count: 5000

## 2011-04-08 LAB — POCT I-STAT, CHEM 8
BUN: 7
Calcium, Ion: 1.03 — ABNORMAL LOW
Chloride: 105
Creatinine, Ser: 0.8
Glucose, Bld: 89
HCT: 33 — ABNORMAL LOW
Hemoglobin: 11.2 — ABNORMAL LOW
Potassium: 3.7
Sodium: 139
TCO2: 25

## 2011-04-08 LAB — URINE MICROSCOPIC-ADD ON

## 2011-04-08 LAB — URINALYSIS, ROUTINE W REFLEX MICROSCOPIC
Bilirubin Urine: NEGATIVE
Glucose, UA: NEGATIVE
Nitrite: NEGATIVE
Specific Gravity, Urine: 1.028
pH: 7.5

## 2011-04-08 LAB — PREGNANCY, URINE: Preg Test, Ur: NEGATIVE

## 2011-04-09 LAB — URINALYSIS, ROUTINE W REFLEX MICROSCOPIC
Bilirubin Urine: NEGATIVE
Hgb urine dipstick: NEGATIVE
Specific Gravity, Urine: 1.015
Urobilinogen, UA: 0.2
pH: 6.5

## 2011-04-09 LAB — POCT PREGNANCY, URINE: Preg Test, Ur: NEGATIVE

## 2011-04-09 LAB — WET PREP, GENITAL
Trich, Wet Prep: NONE SEEN
Yeast Wet Prep HPF POC: NONE SEEN

## 2011-04-09 LAB — GC/CHLAMYDIA PROBE AMP, GENITAL: GC Probe Amp, Genital: NEGATIVE

## 2011-04-12 LAB — BASIC METABOLIC PANEL
BUN: 5 — ABNORMAL LOW
CO2: 25
Calcium: 9.2
Creatinine, Ser: 0.72
Glucose, Bld: 82

## 2011-04-12 LAB — DIFFERENTIAL
Basophils Absolute: 0
Basophils Relative: 1
Monocytes Absolute: 0.4
Neutro Abs: 3
Neutrophils Relative %: 67

## 2011-04-12 LAB — CBC
MCHC: 33
RDW: 16.7 — ABNORMAL HIGH

## 2011-04-13 LAB — DIFFERENTIAL
Eosinophils Absolute: 0.1
Lymphocytes Relative: 50 — ABNORMAL HIGH
Lymphs Abs: 2.4
Monocytes Relative: 5
Neutro Abs: 2.1
Neutrophils Relative %: 44

## 2011-04-13 LAB — URINALYSIS, ROUTINE W REFLEX MICROSCOPIC
Hgb urine dipstick: NEGATIVE
Ketones, ur: NEGATIVE
Protein, ur: NEGATIVE
Urobilinogen, UA: 0.2

## 2011-04-13 LAB — WET PREP, GENITAL: Trich, Wet Prep: NONE SEEN

## 2011-04-13 LAB — CBC
MCV: 81.1
RBC: 4.04
WBC: 4.9

## 2011-04-13 LAB — POCT PREGNANCY, URINE: Preg Test, Ur: NEGATIVE

## 2011-04-16 LAB — URINALYSIS, ROUTINE W REFLEX MICROSCOPIC
Leukocytes, UA: NEGATIVE
Nitrite: NEGATIVE
Protein, ur: 30 — AB
Specific Gravity, Urine: 1.03
Urobilinogen, UA: 0.2

## 2011-04-16 LAB — URINE MICROSCOPIC-ADD ON

## 2011-04-16 LAB — WET PREP, GENITAL
Trich, Wet Prep: NONE SEEN
Yeast Wet Prep HPF POC: NONE SEEN

## 2011-04-16 LAB — DIFFERENTIAL
Lymphocytes Relative: 52 — ABNORMAL HIGH
Lymphs Abs: 3.8
Monocytes Relative: 6
Neutro Abs: 2.8
Neutrophils Relative %: 40 — ABNORMAL LOW

## 2011-04-16 LAB — CBC
MCHC: 32.8
MCV: 77.9 — ABNORMAL LOW
RDW: 16.6 — ABNORMAL HIGH

## 2011-04-16 LAB — GC/CHLAMYDIA PROBE AMP, GENITAL: Chlamydia, DNA Probe: NEGATIVE

## 2011-04-16 LAB — RPR: RPR Ser Ql: NONREACTIVE

## 2011-04-23 LAB — URINE CULTURE: Colony Count: 60000

## 2011-04-23 LAB — I-STAT 8, (EC8 V) (CONVERTED LAB)
Acid-base deficit: 3 — ABNORMAL HIGH
Bicarbonate: 24
HCT: 42
Operator id: 234501
TCO2: 25
pCO2, Ven: 49.3
pH, Ven: 7.295

## 2011-04-23 LAB — URINE MICROSCOPIC-ADD ON

## 2011-04-23 LAB — URINALYSIS, ROUTINE W REFLEX MICROSCOPIC
Bilirubin Urine: NEGATIVE
Glucose, UA: NEGATIVE
Hgb urine dipstick: NEGATIVE
Ketones, ur: NEGATIVE
Protein, ur: NEGATIVE
Specific Gravity, Urine: 1.029
Urobilinogen, UA: 0.2
Urobilinogen, UA: 0.2
pH: 6

## 2011-04-23 LAB — CBC
HCT: 35.9 — ABNORMAL LOW
Hemoglobin: 11.8 — ABNORMAL LOW
Hemoglobin: 11.4 — ABNORMAL LOW
MCHC: 32.8
Platelets: 397
RDW: 15.5 — ABNORMAL HIGH
RDW: 15.2 — ABNORMAL HIGH
WBC: 4.6

## 2011-04-23 LAB — DIFFERENTIAL
Basophils Absolute: 0
Basophils Relative: 0
Eosinophils Relative: 4
Lymphocytes Relative: 37
Monocytes Absolute: 0.5
Neutro Abs: 3.6

## 2011-04-23 LAB — POCT PREGNANCY, URINE: Operator id: 234501

## 2011-04-27 LAB — URINALYSIS, ROUTINE W REFLEX MICROSCOPIC
Ketones, ur: NEGATIVE
Nitrite: NEGATIVE
Protein, ur: NEGATIVE
pH: 6

## 2011-04-27 LAB — CBC
Platelets: 325
WBC: 3.9 — ABNORMAL LOW

## 2011-04-27 LAB — DIFFERENTIAL
Lymphocytes Relative: 49 — ABNORMAL HIGH
Lymphs Abs: 1.9
Neutrophils Relative %: 41 — ABNORMAL LOW

## 2011-04-27 LAB — BASIC METABOLIC PANEL
BUN: 4 — ABNORMAL LOW
Creatinine, Ser: 0.59
GFR calc non Af Amer: >60
Potassium: 3.8

## 2011-04-27 LAB — PREGNANCY, URINE: Preg Test, Ur: NEGATIVE

## 2013-08-23 ENCOUNTER — Encounter (HOSPITAL_COMMUNITY): Payer: Self-pay | Admitting: Emergency Medicine

## 2013-08-23 ENCOUNTER — Emergency Department (HOSPITAL_COMMUNITY): Payer: Medicaid Other

## 2013-08-23 ENCOUNTER — Emergency Department (HOSPITAL_COMMUNITY)
Admission: EM | Admit: 2013-08-23 | Discharge: 2013-08-23 | Disposition: A | Discharge status: Home / Self Care | Payer: Medicaid Other | Attending: Emergency Medicine | Admitting: Emergency Medicine

## 2013-08-23 DIAGNOSIS — R5383 Other fatigue: Secondary | ICD-10-CM

## 2013-08-23 DIAGNOSIS — R1084 Generalized abdominal pain: Secondary | ICD-10-CM | POA: Insufficient documentation

## 2013-08-23 DIAGNOSIS — R197 Diarrhea, unspecified: Secondary | ICD-10-CM | POA: Insufficient documentation

## 2013-08-23 DIAGNOSIS — R110 Nausea: Secondary | ICD-10-CM | POA: Insufficient documentation

## 2013-08-23 DIAGNOSIS — R0602 Shortness of breath: Secondary | ICD-10-CM | POA: Insufficient documentation

## 2013-08-23 DIAGNOSIS — R42 Dizziness and giddiness: Secondary | ICD-10-CM | POA: Insufficient documentation

## 2013-08-23 DIAGNOSIS — R5381 Other malaise: Secondary | ICD-10-CM | POA: Insufficient documentation

## 2013-08-23 DIAGNOSIS — Z3202 Encounter for pregnancy test, result negative: Secondary | ICD-10-CM | POA: Insufficient documentation

## 2013-08-23 DIAGNOSIS — K921 Melena: Secondary | ICD-10-CM | POA: Insufficient documentation

## 2013-08-23 DIAGNOSIS — Z8709 Personal history of other diseases of the respiratory system: Secondary | ICD-10-CM | POA: Insufficient documentation

## 2013-08-23 DIAGNOSIS — R011 Cardiac murmur, unspecified: Secondary | ICD-10-CM | POA: Insufficient documentation

## 2013-08-23 DIAGNOSIS — K920 Hematemesis: Secondary | ICD-10-CM | POA: Insufficient documentation

## 2013-08-23 DIAGNOSIS — R109 Unspecified abdominal pain: Secondary | ICD-10-CM

## 2013-08-23 HISTORY — DX: Cardiac murmur, unspecified: R01.1

## 2013-08-23 HISTORY — DX: Bronchitis, not specified as acute or chronic: J40

## 2013-08-23 LAB — CBC WITH DIFFERENTIAL/PLATELET
Basophils Absolute: 0 10*3/uL (ref 0.0–0.1)
Basophils Relative: 0 % (ref 0–1)
Eosinophils Absolute: 0 10*3/uL (ref 0.0–0.7)
Eosinophils Relative: 1 % (ref 0–5)
HCT: 34.9 % — ABNORMAL LOW (ref 36.0–46.0)
Hemoglobin: 11.9 g/dL — ABNORMAL LOW (ref 12.0–15.0)
Lymphocytes Relative: 50 % — ABNORMAL HIGH (ref 12–46)
Lymphs Abs: 2 10*3/uL (ref 0.7–4.0)
MCH: 27.9 pg (ref 26.0–34.0)
MCHC: 34.1 g/dL (ref 30.0–36.0)
MCV: 81.9 fL (ref 78.0–100.0)
Monocytes Absolute: 0.3 10*3/uL (ref 0.1–1.0)
Monocytes Relative: 7 % (ref 3–12)
Neutro Abs: 1.7 10*3/uL (ref 1.7–7.7)
Neutrophils Relative %: 43 % (ref 43–77)
Platelets: 317 10*3/uL (ref 150–400)
RBC: 4.26 MIL/uL (ref 3.87–5.11)
RDW: 13.9 % (ref 11.5–15.5)
WBC: 3.9 10*3/uL — ABNORMAL LOW (ref 4.0–10.5)

## 2013-08-23 LAB — COMPREHENSIVE METABOLIC PANEL
ALT: 13 U/L (ref 0–35)
AST: 21 U/L (ref 0–37)
Albumin: 4.2 g/dL (ref 3.5–5.2)
Alkaline Phosphatase: 57 U/L (ref 39–117)
BUN: 9 mg/dL (ref 6–23)
CO2: 23 mEq/L (ref 19–32)
Calcium: 9.3 mg/dL (ref 8.4–10.5)
Chloride: 102 mEq/L (ref 96–112)
Creatinine, Ser: 0.76 mg/dL (ref 0.50–1.10)
GFR calc Af Amer: >90 mL/min (ref >90)
GFR calc non Af Amer: >90 mL/min (ref >90)
Glucose, Bld: 92 mg/dL (ref 70–99)
Potassium: 3.7 mEq/L (ref 3.7–5.3)
Sodium: 139 mEq/L (ref 137–147)
Total Bilirubin: <0.2 mg/dL — ABNORMAL LOW (ref 0.3–1.2)
Total Protein: 8.2 g/dL (ref 6.0–8.3)

## 2013-08-23 LAB — URINALYSIS, ROUTINE W REFLEX MICROSCOPIC
Bilirubin Urine: NEGATIVE
Glucose, UA: NEGATIVE mg/dL
Ketones, ur: NEGATIVE mg/dL
Leukocytes, UA: NEGATIVE
Nitrite: NEGATIVE
Protein, ur: NEGATIVE mg/dL
Specific Gravity, Urine: 1.01 (ref 1.005–1.030)
Urobilinogen, UA: 0.2 mg/dL (ref 0.0–1.0)
pH: 5.5 (ref 5.0–8.0)

## 2013-08-23 LAB — LIPASE, BLOOD: Lipase: 34 U/L (ref 11–59)

## 2013-08-23 LAB — PREGNANCY, URINE: Preg Test, Ur: NEGATIVE

## 2013-08-23 LAB — URINE MICROSCOPIC-ADD ON

## 2013-08-23 MED ORDER — MORPHINE SULFATE 4 MG/ML IJ SOLN
6.0000 mg | Freq: Once | INTRAMUSCULAR | Status: AC
  Administered 2013-08-23: 6 mg via INTRAVENOUS
  Filled 2013-08-23: qty 2

## 2013-08-23 MED ORDER — SODIUM CHLORIDE 0.9 % IV BOLUS (SEPSIS)
1000.0000 mL | Freq: Once | INTRAVENOUS | Status: AC
  Administered 2013-08-23: 1000 mL via INTRAVENOUS

## 2013-08-23 MED ORDER — GI COCKTAIL ~~LOC~~
30.0000 mL | Freq: Once | ORAL | Status: DC
  Filled 2013-08-23: qty 30

## 2013-08-23 MED ORDER — PROMETHAZINE HCL 25 MG PO TABS: 25.0000 mg | ORAL_TABLET | Freq: Three times a day (TID) | ORAL | Status: DC | PRN

## 2013-08-23 MED ORDER — HYDROCODONE-ACETAMINOPHEN 5-325 MG PO TABS: 1.0000 | ORAL_TABLET | Freq: Four times a day (QID) | ORAL | Status: DC | PRN

## 2013-08-23 MED ORDER — ONDANSETRON HCL 4 MG/2ML IJ SOLN
4.0000 mg | Freq: Once | INTRAMUSCULAR | Status: AC
  Administered 2013-08-23: 4 mg via INTRAVENOUS
  Filled 2013-08-23: qty 2

## 2013-08-23 MED ORDER — IOHEXOL 300 MG/ML  SOLN
100.0000 mL | Freq: Once | INTRAMUSCULAR | Status: AC | PRN
  Administered 2013-08-23: 100 mL via INTRAVENOUS

## 2013-08-23 MED ORDER — IOHEXOL 300 MG/ML  SOLN
25.0000 mL | INTRAMUSCULAR | Status: AC
  Administered 2013-08-23 (×2): 25 mL via ORAL

## 2013-08-23 MED ORDER — ONDANSETRON 4 MG PO TBDP
8.0000 mg | ORAL_TABLET | Freq: Once | ORAL | Status: AC
  Administered 2013-08-23: 8 mg via ORAL
  Filled 2013-08-23: qty 2

## 2013-08-23 NOTE — ED Notes (Signed)
PT comfortable with d/c and f/u instructions. Prescriptions x2 given to pt.

## 2013-08-23 NOTE — ED Notes (Signed)
Patient transported to CT 

## 2013-08-23 NOTE — ED Notes (Signed)
Offered bus pass to patient, pt declined, pt able to contact a friend to transport her home. PT states is comfortable with d/c and f/u instructions at this time.

## 2013-08-23 NOTE — ED Provider Notes (Signed)
CSN: [id number]     Arrival date & time 08/23/13  0920 History   First MD Initiated Contact with Patient 08/23/13 [phone]     Chief Complaint  Patient presents with  . Abdominal Pain  . Hematemesis     (Consider location/radiation/quality/duration/timing/severity/associated sxs/prior Treatment) HPI 28 yo black female presents with abdominal pain and hematemesis. Patient describes having the abdominal pain for the past couple of months and forcing herself to throw up for relief a couple of times over the past months. During that time she describes a bilateral upper abdominal cramping that is on and off all day. Additionally during this time period she describes having blood and mucus in her stool a "couple of weeks" ago. She has been feeling tired and SOB with light activity too recently. This morning while feeling nauseated she coughed and then threw up and noticed blood in her throw up. She quantifies that amount of blood as a cup mixed in. She denies diarrhea and chest pain.   Past Medical History  Diagnosis Date  . Bronchitis   . Heart murmur    History reviewed. No pertinent past surgical history. History reviewed. No pertinent family history. History  Substance Use Topics  . Smoking status: Not on file  . Smokeless tobacco: Not on file  . Alcohol Use: No   OB History   Grav Para Term Preterm Abortions TAB SAB Ect Mult Living                 Review of Systems  Constitutional: Positive for fatigue.  Respiratory: Positive for shortness of breath. Negative for chest tightness.   Cardiovascular: Negative for chest pain.  Gastrointestinal: Positive for nausea, vomiting, abdominal pain, diarrhea and blood in stool.  Neurological: Positive for dizziness and weakness.      Allergies  Other  Home Medications   Current Outpatient Rx  Name  Route  Sig  Dispense  Refill  . hydrocortisone 2.5 % cream   Topical   Apply 1 application topically 2 (two) times daily as needed  (itching).          BP 111/69  Pulse 64  Temp(Src) 98.2 F (36.8 C) (Oral)  Resp 16  SpO2 100%  LMP 07/27/2013 Physical Exam  Nursing note and vitals reviewed. Constitutional: She is oriented to person, place, and time. She appears well-developed and well-nourished.  HENT:  Head: Normocephalic and atraumatic.  Mouth/Throat: Oropharynx is clear and moist.  Eyes: Pupils are equal, round, and reactive to light.  Neck: Normal range of motion. Neck supple.  Cardiovascular: Normal rate and regular rhythm.  Exam reveals no gallop and no friction rub.   Murmur heard. Pulmonary/Chest: Effort normal and breath sounds normal. No respiratory distress. She has no wheezes. She has no rales.  Abdominal: Soft. Normal appearance and bowel sounds are normal. She exhibits no abdominal bruit. There is generalized tenderness. There is no rigidity, no rebound, no guarding, no tenderness at McBurney's point and negative Murphy's sign.  Genitourinary: Vagina normal and uterus normal. Cervix exhibits no motion tenderness and no discharge. Right adnexum displays no mass, no tenderness and no fullness. Left adnexum displays no mass, no tenderness and no fullness.  Neurological: She is alert and oriented to person, place, and time. Coordination normal.  Skin: Skin is warm and dry. No rash noted. No erythema.    ED Course  Procedures (including critical care time)   Patient does not have any abnormalities noted on CT scan and her lab testing  done indicates significant abnormality.  Patient retreated for generalized abdominal pain, and advised followup with GYN.  She did have a fibroid noted on CT scan of her uterus.  Patient's best return here as needed.  Patient is feeling some better following pain medication and IV fluids   Carlyle DollyChristopher W Jennah Satchell, PA-C 08/24/13 1327

## 2013-08-23 NOTE — ED Notes (Signed)
Upon assisting pt to change out of gown, pt became very nauseous and began dry heaving. Chris L. PA notified.

## 2013-08-23 NOTE — Discharge Instructions (Signed)
Return here as needed. FOllow up with the clinic provided. Increase your fluids

## 2013-08-23 NOTE — ED Notes (Signed)
Pt reports feeling better at this time. Pt no longer dry heaving. VSS, pt in NAD

## 2013-08-23 NOTE — ED Notes (Signed)
Reports generalized abd pain for extended amount of time, had episode of vomiting blood this am.

## 2013-08-24 LAB — GC/CHLAMYDIA PROBE AMP
CT Probe RNA: NEGATIVE
GC Probe RNA: NEGATIVE

## 2013-08-27 NOTE — ED Provider Notes (Signed)
Medical screening examination/treatment/procedure(s) were performed by non-physician practitioner and as supervising physician I was immediately available for consultation/collaboration.  EKG Interpretation   None        Sheala Dosh, MD 08/27/13 1416 

## 2013-09-03 ENCOUNTER — Encounter: Payer: Self-pay | Admitting: Family Medicine

## 2013-09-03 ENCOUNTER — Other Ambulatory Visit (HOSPITAL_COMMUNITY)
Admission: RE | Admit: 2013-09-03 | Discharge: 2013-09-03 | Disposition: A | Discharge status: Home / Self Care | Payer: Medicaid Other | Source: Ambulatory Visit | Attending: Family Medicine | Admitting: Family Medicine

## 2013-09-03 ENCOUNTER — Ambulatory Visit (INDEPENDENT_AMBULATORY_CARE_PROVIDER_SITE_OTHER): Payer: Medicaid Other | Admitting: Family Medicine

## 2013-09-03 VITALS — BP 130/96 | HR 90 | Temp 97.7°F | Ht 64.0 in | Wt 174.4 lb

## 2013-09-03 DIAGNOSIS — Z01419 Encounter for gynecological examination (general) (routine) without abnormal findings: Secondary | ICD-10-CM | POA: Insufficient documentation

## 2013-09-03 DIAGNOSIS — Z0000 Encounter for general adult medical examination without abnormal findings: Secondary | ICD-10-CM

## 2013-09-03 DIAGNOSIS — R21 Rash and other nonspecific skin eruption: Secondary | ICD-10-CM

## 2013-09-03 DIAGNOSIS — N946 Dysmenorrhea, unspecified: Secondary | ICD-10-CM

## 2013-09-03 DIAGNOSIS — N920 Excessive and frequent menstruation with regular cycle: Secondary | ICD-10-CM

## 2013-09-03 MED ORDER — NORGESTIM-ETH ESTRAD TRIPHASIC 0.18/0.215/0.25 MG-25 MCG PO TABS
1.0000 | ORAL_TABLET | Freq: Every day | ORAL | Status: AC
Start: 2013-09-03 — End: ?

## 2013-09-03 MED ORDER — HYDROCORTISONE 2.5 % EX CREA: 1.0000 "application " | TOPICAL_CREAM | Freq: Two times a day (BID) | CUTANEOUS | Status: DC | PRN

## 2013-09-03 NOTE — Patient Instructions (Signed)
Oral Contraception Use  Oral contraceptive pills (OCPs) are medicines taken to prevent pregnancy. OCPs work by preventing the ovaries from releasing eggs. The hormones in OCPs also cause the cervical mucus to thicken, preventing the sperm from entering the uterus. The hormones also cause the uterine lining to become thin, not allowing a fertilized egg to attach to the inside of the uterus. OCPs are highly effective when taken exactly as prescribed. However, OCPs do not prevent sexually transmitted diseases (STDs). Safe sex practices, such as using condoms along with an OCP, can help prevent STDs.  Before taking OCPs, you may have a physical exam and Pap test. Your health care provider may also order blood tests if necessary. Your health care provider will make sure you are a good candidate for oral contraception. Discuss with your health care provider the possible side effects of the OCP you may be prescribed. When starting an OCP, it can take 2 to 3 months for the body to adjust to the changes in hormone levels in your body.   HOW TO TAKE ORAL CONTRACEPTIVE PILLS  Your health care provider may advise you on how to start taking the first cycle of OCPs. Otherwise, you can:   · Start on day 1 of your menstrual period. You will not need any backup contraceptive protection with this start time.    · Start on the first Sunday after your menstrual period or the day you get your prescription. In these cases, you will need to use backup contraceptive protection for the first week.    · Start the pill at any time of your cycle. If you take the pill within 5 days of the start of your period, you are protected against pregnancy right away. In this case, you will not need a backup form of birth control. If you start at any other time of your menstrual cycle, you will need to use another form of birth control for 7 days. If your OCP is the type called a minipill, it will protect you from pregnancy after taking it for 2 days (48  hours).  After you have started taking OCPs:   · If you forget to take 1 pill, take it as soon as you remember. Take the next pill at the regular time.    · If you miss 2 or more pills, call your health care provider because different pills have different instructions for missed doses. Use backup birth control until your next menstrual period starts.    · If you use a 28-day pack that contains inactive pills and you miss 1 of the last 7 pills (pills with no hormones), it will not matter. Throw away the rest of the nonhormone pills and start a new pill pack.    No matter which day you start the OCP, you will always start a new pack on that same day of the week. Have an extra pack of OCPs and a backup contraceptive method available in case you miss some pills or lose your OCP pack.   HOME CARE INSTRUCTIONS   · Do not smoke.    · Always use a condom to protect against STDs. OCPs do not protect against STDs.    · Use a calendar to mark your menstrual period days.    · Read the information and directions that came with your OCP. Talk to your health care provider if you have questions.    SEEK MEDICAL CARE IF:   · You develop nausea and vomiting.    · You have abnormal vaginal discharge   or bleeding.    · You develop a rash.    · You miss your menstrual period.    · You are losing your hair.    · You need treatment for mood swings or depression.    · You get dizzy when taking the OCP.    · You develop acne from taking the OCP.    · You become pregnant.    SEEK IMMEDIATE MEDICAL CARE IF:   · You develop chest pain.    · You develop shortness of breath.    · You have an uncontrolled or severe headache.    · You develop numbness or slurred speech.    · You develop visual problems.    · You develop pain, redness, and swelling in the legs.    Document Released: 06/17/2011 Document Revised: 02/28/2013 Document Reviewed: 12/17/2012  [employer]® Patient Information ©2014 [employer].

## 2013-09-03 NOTE — Progress Notes (Signed)
GYN OFFICE NOTE  Chief Complaint:  Abdominal pain  Primary Care Physician: No primary provider on file.  HPI:  Ashley Brown is a 28 yo who presented to the [facility] ED for hematemesis and abdominal pain. Was seen there and had a full workup.  A CT scan was done that showed a heterogeneous enhancement of the uterus that could be related to fibroid disease.  States has had over a year of abdominal pain. Comes and goes At its worst when she is on her period and it is about a 10/10.  At its best it is gone Cramping pain mostly in lower pelvis but diffuse throughout her abdomen  [place] Last pap about 1 year ago.  Has had abnormal but not sure what they were.     Irregular periods Has clots and pain for 2 days and then slows down At it's worst she fills 5 pads a day LMP- 08/23/13   PMHx:  Past Medical History  Diagnosis Date  . Bronchitis   . Heart murmur     No past surgical history on file.  FAMHx:  No family history on file.  SOCHx:   reports that she has never smoked. She has never used smokeless tobacco. She reports that she drinks alcohol. She reports that she does not use illicit drugs.  ALLERGIES:  Allergies  Allergen Reactions  . Other     GI cocktail  Causes tongue and throat itch     ROS: Pertinent ROS as seen in HPI. Otherwise negative.   HOME MEDS: Current Outpatient Prescriptions  Medication Sig Dispense Refill  . hydrocortisone 2.5 % cream Apply 1 application topically 2 (two) times daily as needed (itching).  30 g  1  . promethazine (PHENERGAN) 25 MG tablet Take 1 tablet (25 mg total) by mouth every 8 (eight) hours as needed for nausea or vomiting.  15 tablet  0  . HYDROcodone-acetaminophen (NORCO/VICODIN) 5-325 MG per tablet Take 1 tablet by mouth every 6 (six) hours as needed for moderate pain.  15 tablet  0  . Norgestimate-Ethinyl Estradiol Triphasic 0.18/0.215/0.25 MG-25 MCG tab Take 1 tablet by mouth daily.  1 Package  2   No  current facility-administered medications for this visit.    LABS/IMAGING: No results found for this or any previous visit (from the past 48 hour(s)). No results found.  VITALS: BP 130/96  Pulse 90  Temp(Src) 97.7 F (36.5 C)  Ht 5\' 4"  (1.626 m)  Wt 174 lb 6.4 oz (79.107 kg)  BMI 29.92 kg/m2  LMP 08/23/2013  EXAM: GENERAL: Well-developed, well-nourished female in no acute distress.  HEENT: Normocephalic, atraumatic. Sclerae anicteric.  NECK: Supple. Normal thyroid.  LUNGS: Clear to auscultation bilaterally.  HEART: Regular rate and rhythm. 2/6 SEM.  BREASTS: hyperpigmented irritation in the folds of her breasts.  ABDOMEN: Soft, nontender, nondistended. No organomegaly. PELVIC: Normal external female genitalia. Vagina is pink and rugated.  Normal discharge. Normal cervix contour. Pap smear obtained. Uterus is normal in size. No adnexal mass or tenderness.  EXTREMITIES: No cyanosis, clubbing, or edema, 2+ distal pulses.     ASSESSMENT: Menorrhagia - Plan: Norgestimate-Ethinyl Estradiol Triphasic 0.18/0.215/0.25 MG-25 MCG tab  Dysmenorrhea - Plan: Norgestimate-Ethinyl Estradiol Triphasic 0.18/0.215/0.25 MG-25 MCG tab  Rash and nonspecific skin eruption - Plan: hydrocortisone 2.5 % cream  Annual physical exam - Plan: Cytology - PAP  PLAN:  Menorrhagia/dysmenorrhea - doubt based on CT results that this is related to fibroid disease.  Suspect more adenomyosis  in the setting of pain worsening with menstruation and persistent throughout - trial of OCPs (pt with a hx of occasional migraines but no aura and not wanting to try progesterone form) - hgb checked in ER and 11.9  Rash - contact dermatitis likely related to previous intertrigo but now with irritation and hyperpigmentation - rx of hydrocortisone as this has worked for her in the past  PAP done today  F/u in 3months.   Azarria Balint, Redmond BasemanKELI L, MD

## 2013-09-05 ENCOUNTER — Telehealth: Payer: Self-pay

## 2013-09-05 NOTE — Telephone Encounter (Signed)
Message copied by Louanna RawAMPBELL, Rondel Episcopo M on Wed Sep 05, 2013  1:57 PM ------      Message from: Vale HavenBECK, KELI L      Created: Wed Sep 05, 2013  1:44 PM       Normal pap. Can you let her know? Thanks! ------

## 2013-09-05 NOTE — Telephone Encounter (Signed)
Called pt. And informed of normal pap. NO questions or concerns.

## 2013-11-02 ENCOUNTER — Other Ambulatory Visit: Payer: Self-pay | Admitting: Physician Assistant

## 2013-11-02 DIAGNOSIS — N63 Unspecified lump in unspecified breast: Secondary | ICD-10-CM

## 2013-11-09 ENCOUNTER — Ambulatory Visit
Admission: RE | Admit: 2013-11-09 | Discharge: 2013-11-09 | Disposition: A | Discharge status: Home / Self Care | Payer: Medicaid Other | Source: Ambulatory Visit | Attending: Physician Assistant | Admitting: Physician Assistant

## 2013-11-09 DIAGNOSIS — N63 Unspecified lump in unspecified breast: Secondary | ICD-10-CM

## 2013-12-11 ENCOUNTER — Emergency Department (HOSPITAL_COMMUNITY): Payer: Medicaid Other

## 2013-12-11 ENCOUNTER — Encounter (HOSPITAL_COMMUNITY): Payer: Self-pay | Admitting: Emergency Medicine

## 2013-12-11 DIAGNOSIS — R079 Chest pain, unspecified: Secondary | ICD-10-CM | POA: Insufficient documentation

## 2013-12-11 DIAGNOSIS — Z8709 Personal history of other diseases of the respiratory system: Secondary | ICD-10-CM | POA: Insufficient documentation

## 2013-12-11 DIAGNOSIS — R011 Cardiac murmur, unspecified: Secondary | ICD-10-CM | POA: Insufficient documentation

## 2013-12-11 DIAGNOSIS — Z791 Long term (current) use of non-steroidal anti-inflammatories (NSAID): Secondary | ICD-10-CM | POA: Insufficient documentation

## 2013-12-11 DIAGNOSIS — Z79899 Other long term (current) drug therapy: Secondary | ICD-10-CM | POA: Insufficient documentation

## 2013-12-11 LAB — CBC
HCT: 34.6 % — ABNORMAL LOW (ref 36.0–46.0)
HEMOGLOBIN: 11.6 g/dL — AB (ref 12.0–15.0)
MCH: 28.2 pg (ref 26.0–34.0)
MCHC: 33.5 g/dL (ref 30.0–36.0)
MCV: 84.2 fL (ref 78.0–100.0)
Platelets: 295 10*3/uL (ref 150–400)
RBC: 4.11 MIL/uL (ref 3.87–5.11)
RDW: 13.7 % (ref 11.5–15.5)
WBC: 6.6 10*3/uL (ref 4.0–10.5)

## 2013-12-11 LAB — BASIC METABOLIC PANEL
BUN: 7 mg/dL (ref 6–23)
CHLORIDE: 101 meq/L (ref 96–112)
CO2: 26 meq/L (ref 19–32)
Calcium: 9.2 mg/dL (ref 8.4–10.5)
Creatinine, Ser: 0.71 mg/dL (ref 0.50–1.10)
GFR calc Af Amer: >90 mL/min (ref >90)
Glucose, Bld: 113 mg/dL — ABNORMAL HIGH (ref 70–99)
POTASSIUM: 3.3 meq/L — AB (ref 3.7–5.3)
SODIUM: 139 meq/L (ref 137–147)

## 2013-12-11 MED ORDER — OXYCODONE-ACETAMINOPHEN 5-325 MG PO TABS
1.0000 | ORAL_TABLET | Freq: Once | ORAL | Status: AC
  Administered 2013-12-11: 1 via ORAL
  Filled 2013-12-11: qty 1

## 2013-12-11 MED ORDER — IBUPROFEN 200 MG PO TABS: 400.0000 mg | ORAL_TABLET | Freq: Once | ORAL | Status: DC

## 2013-12-11 MED ORDER — OXYCODONE-ACETAMINOPHEN 5-325 MG PO TABS: 1.0000 | ORAL_TABLET | Freq: Once | ORAL | Status: DC

## 2013-12-11 NOTE — ED Notes (Signed)
Present with sharp chest pain began last night radiation into upper back and shoulders. Pain is worse with any movements and deep breathing.  Denies injury. Pain is not reproducable with touch.

## 2013-12-12 ENCOUNTER — Emergency Department (HOSPITAL_COMMUNITY)
Admission: EM | Admit: 2013-12-12 | Discharge: 2013-12-12 | Disposition: A | Discharge status: Home / Self Care | Payer: Medicaid Other | Attending: Emergency Medicine | Admitting: Emergency Medicine

## 2013-12-12 DIAGNOSIS — R079 Chest pain, unspecified: Secondary | ICD-10-CM

## 2013-12-12 LAB — D-DIMER, QUANTITATIVE (NOT AT ARMC): D-Dimer, Quant: <0.27 ug/mL-FEU (ref 0.00–0.48)

## 2013-12-12 MED ORDER — DIAZEPAM 5 MG PO TABS
5.0000 mg | ORAL_TABLET | Freq: Once | ORAL | Status: AC
  Administered 2013-12-12: 5 mg via ORAL
  Filled 2013-12-12: qty 1

## 2013-12-12 MED ORDER — KETOROLAC TROMETHAMINE 60 MG/2ML IM SOLN: 60.0000 mg | Freq: Once | INTRAMUSCULAR | Status: DC

## 2013-12-12 MED ORDER — MORPHINE SULFATE 4 MG/ML IJ SOLN
4.0000 mg | Freq: Once | INTRAMUSCULAR | Status: AC
  Administered 2013-12-12: 4 mg via INTRAVENOUS
  Filled 2013-12-12: qty 1

## 2013-12-12 MED ORDER — NAPROXEN 500 MG PO TABS: 500.0000 mg | ORAL_TABLET | Freq: Two times a day (BID) | ORAL | Status: AC

## 2013-12-12 MED ORDER — KETOROLAC TROMETHAMINE 30 MG/ML IJ SOLN
30.0000 mg | Freq: Once | INTRAMUSCULAR | Status: AC
  Administered 2013-12-12: 30 mg via INTRAVENOUS
  Filled 2013-12-12: qty 1

## 2013-12-12 NOTE — ED Provider Notes (Signed)
CSN: [id number]     Arrival date & time 12/11/13  2158 History   First MD Initiated Contact with Patient 12/12/13 0119     Chief Complaint  Patient presents with  . Chest Pain      HPI Patient presents to the emergency department because of chest pain with radiation up to her right scapular region.  The chest pain was located in her right upper chest.  Pain is worse with movement and deep breathing.  Denies recent injury.  Denies rash.  She has no shortness of breath fevers or chills.  Her pain is worse with deep breathing.  She is on birth control.  She does not smoke cigarettes.  No history of DVT or pulmonary embolism.  Symptoms are mild in severity   Past Medical History  Diagnosis Date  . Bronchitis   . Heart murmur    History reviewed. No pertinent past surgical history. History reviewed. No pertinent family history. History  Substance Use Topics  . Smoking status: Never Smoker   . Smokeless tobacco: Never Used  . Alcohol Use: Yes     Comment: occasional    OB History   Grav Para Term Preterm Abortions TAB SAB Ect Mult Living   4 4 1 3  0 0 0 0 0 4     Review of Systems  All other systems reviewed and are negative.     Allergies  Other  Home Medications   Prior to Admission medications   Medication Sig Start Date End Date Taking? Authorizing Provider  Norgestimate-Ethinyl Estradiol Triphasic 0.18/0.215/0.25 MG-25 MCG tab Take 1 tablet by mouth daily. 09/03/13  Yes Vale Haven, MD  naproxen (NAPROSYN) 500 MG tablet Take 1 tablet (500 mg total) by mouth 2 (two) times daily. 12/12/13   Lyanne Co, MD   BP 121/79  Pulse 85  Temp(Src) 98.3 F (36.8 C) (Oral)  Resp 14  SpO2 100%  LMP 11/28/2013 Physical Exam  Nursing note and vitals reviewed. Constitutional: She is oriented to person, place, and time. She appears well-developed and well-nourished. No distress.  HENT:  Head: Normocephalic and atraumatic.  Eyes: EOM are normal.  Neck: Normal range of motion.   Cardiovascular: Normal rate, regular rhythm and normal heart sounds.   Pulmonary/Chest: Effort normal and breath sounds normal.  Reproducible chest pain in her anterior upper right chest.    Abdominal: Soft. She exhibits no distension. There is no tenderness.  Musculoskeletal: Normal range of motion.  Normal pulses in right radial artery.  Full range of motion of right shoulder.  Mild reproducible pain on the medial aspect of her right scapula without scapular deformity noted.  No bruising of her back.  No cervical or thoracic tenderness  Neurological: She is alert and oriented to person, place, and time.  Skin: Skin is warm and dry.  Psychiatric: She has a normal mood and affect. Judgment normal.    ED Course  Procedures (including critical care time) Labs Review Labs Reviewed  BASIC METABOLIC PANEL - Abnormal; Notable for the following:    Potassium 3.3 (*)    Glucose, Bld 113 (*)    All other components within normal limits  CBC - Abnormal; Notable for the following:    Hemoglobin 11.6 (*)    HCT 34.6 (*)    All other components within normal limits  D-DIMER, QUANTITATIVE  I-STAT TROPOININ, ED    Imaging Review Dg Chest 2 View  12/11/2013   CLINICAL DATA:  Chest pain for  2 days.  EXAM: CHEST  2 VIEW  COMPARISON:  04/14/2008  FINDINGS: Heart, mediastinum hila are normal. Lungs are clear. No pleural effusion. No pneumothorax. The bony thorax is unremarkable.  IMPRESSION: No active cardiopulmonary disease.   Electronically Signed   By: Amie Portlandavid  Ormond M.D.   On: 12/11/2013 22:58  I personally reviewed the imaging tests through PACS system I reviewed available ER/hospitalization records through the EMR    EKG Interpretation   Date/Time:  Tuesday December 11 2013 22:07:35 EDT Ventricular Rate:  89 PR Interval:  146 QRS Duration: 80 QT Interval:  366 QTC Calculation: 445 R Axis:   75 Text Interpretation:  Normal sinus rhythm Nonspecific T wave abnormality  Abnormal ECG No  significant change was found Confirmed by Zarielle Cea  MD,  Jonee Lamore ([id number]) on 12/12/2013 1:31:35 AM      MDM   Final diagnoses:  Chest pain    Likely musculoskeletal chest pain.  Discharge home in good condition.  Home with anti-inflammatory medicine.    Lyanne CoKevin M Lenzy Kerschner, MD 12/12/13 [id number]

## 2013-12-12 NOTE — Discharge Instructions (Signed)

## 2013-12-12 NOTE — ED Notes (Signed)
Discussed with patient not to drive for 4 hours. Patient reports she does not drive and uses city transportation. Preparing for discharge.

## 2013-12-19 ENCOUNTER — Emergency Department (INDEPENDENT_AMBULATORY_CARE_PROVIDER_SITE_OTHER)
Admission: EM | Admit: 2013-12-19 | Discharge: 2013-12-19 | Disposition: A | Discharge status: Home / Self Care | Payer: Medicaid Other | Source: Home / Self Care

## 2013-12-19 ENCOUNTER — Encounter (HOSPITAL_COMMUNITY): Payer: Self-pay | Admitting: Emergency Medicine

## 2013-12-19 DIAGNOSIS — K5900 Constipation, unspecified: Secondary | ICD-10-CM

## 2013-12-19 DIAGNOSIS — F40218 Other animal type phobia: Secondary | ICD-10-CM

## 2013-12-19 DIAGNOSIS — F40298 Other specified phobia: Secondary | ICD-10-CM

## 2013-12-19 DIAGNOSIS — R197 Diarrhea, unspecified: Secondary | ICD-10-CM

## 2013-12-19 NOTE — ED Provider Notes (Signed)
CSN: [id number]     Arrival date & time 12/19/13  1037 History   First MD Initiated Contact with Patient 12/19/13 1240     Chief Complaint  Patient presents with  . Diarrhea   (Consider location/radiation/quality/duration/timing/severity/associated sxs/prior Treatment) HPI Comments: C/O of GI issues for over a yr. Alternating diarrhea and constipation. Little to no abdominal pain. No N or V.   Past Medical History  Diagnosis Date  . Bronchitis   . Heart murmur    History reviewed. No pertinent past surgical history. History reviewed. No pertinent family history. History  Substance Use Topics  . Smoking status: Never Smoker   . Smokeless tobacco: Never Used  . Alcohol Use: Yes     Comment: occasional    OB History   Grav Para Term Preterm Abortions TAB SAB Ect Mult Living   4 4 1 3  0 0 0 0 0 4     Review of Systems  Constitutional: Negative.   Gastrointestinal: Positive for diarrhea and constipation. Negative for blood in stool and rectal pain.       As per HPI  Genitourinary: Negative.   Skin: Negative for rash.  Neurological: Negative.     Allergies  Other  Home Medications   Prior to Admission medications   Medication Sig Start Date End Date Taking? Authorizing Provider  naproxen (NAPROSYN) 500 MG tablet Take 1 tablet (500 mg total) by mouth 2 (two) times daily. 12/12/13   Lyanne Co, MD  Norgestimate-Ethinyl Estradiol Triphasic 0.18/0.215/0.25 MG-25 MCG tab Take 1 tablet by mouth daily. 09/03/13   Vale Haven, MD   BP 143/93  Pulse 52  Temp(Src) 98.5 F (36.9 C) (Oral)  Resp 18  SpO2 100%  LMP 12/19/2013 Physical Exam  Nursing note and vitals reviewed. Constitutional: She is oriented to person, place, and time. She appears well-developed and well-nourished. No distress.  Pulmonary/Chest: Effort normal. No respiratory distress.  Abdominal: Soft. Bowel sounds are normal. She exhibits no distension and no mass. There is no tenderness. There is no rebound  and no guarding.  Musculoskeletal: She exhibits no edema.  Neurological: She is alert and oriented to person, place, and time.  Skin: Skin is warm and dry.  Psychiatric: She has a normal mood and affect.    ED Course  Procedures (including critical care time) Labs Review Labs Reviewed - No data to display  Imaging Review No results found.   MDM   1. Diarrhea   2. Constipation   3. Fear of parasites     Given instructions and equipment for collecting stool for O and P. Will call results Keep appt with PCP this month.      Hayden Rasmussen, NP 12/19/13 1309

## 2013-12-19 NOTE — ED Notes (Signed)
Pt  Given  apporpriate    Equipment  To obtain a  Stool  specemin  And  Given op  Slip

## 2013-12-19 NOTE — ED Notes (Signed)
Pt  Has  Various  Symptoms  Of  abd  Pain  Cramping   Noticed  Stringy  White  Threads  In  Stool         A  Few  Days  Ago         Various  disfferent  Complaints  To nclude  Headache  Body  Aches  And  Rash  For  Quite  A  While              She  Ambulated  To  Room and  Is  Sitting  Upright opn  Exam table  Speaking in  Complete  sentances

## 2013-12-19 NOTE — ED Provider Notes (Signed)
Medical screening examination/treatment/procedure(s) were performed by a resident physician or non-physician practitioner and as the supervising physician I was immediately available for consultation/collaboration.  Murriel Eidem, MD    Anitra Doxtater S Elza Varricchio, MD 12/19/13 1612 

## 2013-12-19 NOTE — Discharge Instructions (Signed)
Bloating Bloating is the feeling of fullness in your belly. You may feel as though your pants are too tight. Often the cause of bloating is overeating, retaining fluids, or having gas in your bowel. It is also caused by swallowing air and eating foods that cause gas. Irritable bowel syndrome is one of the most common causes of bloating. Constipation is also a common cause. Sometimes more serious problems can cause bloating. SYMPTOMS  Usually there is a feeling of fullness, as though your abdomen is bulged out. There may be mild discomfort.  DIAGNOSIS  Usually no particular testing is necessary for most bloating. If the condition persists and seems to become worse, your caregiver may do additional testing.  TREATMENT   There is no direct treatment for bloating.  Do not put gas into the bowel. Avoid chewing gum and sucking on candy. These tend to make you swallow air. Swallowing air can also be a nervous habit. Try to avoid this.  Avoiding high residue diets will help. Eat foods with soluble fibers (examples include root vegetables, apples, or barley) and substitute dairy products with soy and rice products. This helps irritable bowel syndrome.  If constipation is the cause, then a high residue diet with more fiber will help.  Avoid carbonated beverages.  Over-the-counter preparations are available that help reduce gas. Your pharmacist can help you with this. SEEK MEDICAL CARE IF:   Bloating continues and seems to be getting worse.  You notice a weight gain.  You have a weight loss but the bloating is getting worse.  You have changes in your bowel habits or develop nausea or vomiting. SEEK IMMEDIATE MEDICAL CARE IF:   You develop shortness of breath or swelling in your legs.  You have an increase in abdominal pain or develop chest pain. Document Released: 04/28/2006 Document Revised: 09/20/2011 Document Reviewed: 06/16/2007 [facility] Patient Information 2014 [place]  [place]  Irritable Bowel Syndrome Irritable Bowel Syndrome (IBS) is caused by a disturbance of normal bowel function. Other terms used are spastic colon, mucous colitis, and irritable colon. It does not require surgery, nor does it lead to cancer. There is no cure for IBS. But with proper diet, stress reduction, and medication, you will find that your problems (symptoms) will gradually disappear or improve. IBS is a common digestive disorder. It usually appears in late adolescence or early adulthood. Women develop it twice as often as men. CAUSES  After food has been digested and absorbed in the small intestine, waste material is moved into the colon (large intestine). In the colon, water and salts are absorbed from the undigested products coming from the small intestine. The remaining residue, or fecal material, is held for elimination. Under normal circumstances, gentle, rhythmic contractions on the bowel walls push the fecal material along the colon towards the rectum. In IBS, however, these contractions are irregular and poorly coordinated. The fecal material is either retained too long, resulting in constipation, or expelled too soon, producing diarrhea. SYMPTOMS  The most common symptom of IBS is pain. It is typically in the lower left side of the belly (abdomen). But it may occur anywhere in the abdomen. It can be felt as heartburn, backache, or even as a dull pain in the arms or shoulders. The pain comes from excessive bowel-muscle spasms and from the buildup of gas and fecal material in the colon. This pain:  Can range from sharp belly (abdominal) cramps to a dull, continuous ache.  Usually worsens soon after eating.  Is typically  relieved by having a bowel movement or passing gas. Abdominal pain is usually accompanied by constipation. But it may also produce diarrhea. The diarrhea typically occurs right after a meal or upon arising in the morning. The stools are typically soft and watery. They  are often flecked with secretions (mucus). Other symptoms of IBS include:  Bloating.  Loss of appetite.  Heartburn.  Feeling sick to your stomach (nausea).  Belching  Vomiting  Gas. IBS may also cause a number of symptoms that are unrelated to the digestive system:  Fatigue.  Headaches.  Anxiety  Shortness of breath  Difficulty in concentrating.  Dizziness. These symptoms tend to come and go. DIAGNOSIS  The symptoms of IBS closely mimic the symptoms of other, more serious digestive disorders. So your caregiver may wish to perform a variety of additional tests to exclude these disorders. He/she wants to be certain of learning what is wrong (diagnosis). The nature and purpose of each test will be explained to you. TREATMENT A number of medications are available to help correct bowel function and/or relieve bowel spasms and abdominal pain. Among the drugs available are:  Mild, non-irritating laxatives for severe constipation and to help restore normal bowel habits.  Specific anti-diarrheal medications to treat severe or prolonged diarrhea.  Anti-spasmodic agents to relieve intestinal cramps.  Your caregiver may also decide to treat you with a mild tranquilizer or sedative during unusually stressful periods in your life. The important thing to remember is that if any drug is prescribed for you, make sure that you take it exactly as directed. Make sure that your caregiver knows how well it worked for you. HOME CARE INSTRUCTIONS   Avoid foods that are high in fat or oils. Some examples WVP:XTGGY cream, butter, frankfurters, sausage, and other fatty meats.  Avoid foods that have a laxative effect, such as fruit, fruit juice, and dairy products.  Cut out carbonated drinks, chewing gum, and "gassy" foods, such as beans and cabbage. This may help relieve bloating and belching.  Bran taken with plenty of liquids may help relieve constipation.  Keep track of what foods seem  to trigger your symptoms.  Avoid emotionally charged situations or circumstances that produce anxiety.  Start or continue exercising.  Get plenty of rest and sleep. MAKE SURE YOU:   Understand these instructions.  Will watch your condition.  Will get help right away if you are not doing well or get worse. Document Released: 06/28/2005 Document Revised: 09/20/2011 Document Reviewed: 02/16/2008 [facility] Patient Information 2014 [place], [place].

## 2014-05-13 ENCOUNTER — Encounter (HOSPITAL_COMMUNITY): Payer: Self-pay | Admitting: Emergency Medicine

## 2014-07-15 ENCOUNTER — Encounter (HOSPITAL_COMMUNITY): Payer: Self-pay | Admitting: Cardiology

## 2014-07-15 ENCOUNTER — Emergency Department (HOSPITAL_COMMUNITY)
Admission: EM | Admit: 2014-07-15 | Discharge: 2014-07-15 | Disposition: A | Discharge status: Home / Self Care | Payer: Medicaid Other | Attending: Emergency Medicine | Admitting: Emergency Medicine

## 2014-07-15 DIAGNOSIS — J0140 Acute pansinusitis, unspecified: Secondary | ICD-10-CM | POA: Insufficient documentation

## 2014-07-15 DIAGNOSIS — Z791 Long term (current) use of non-steroidal anti-inflammatories (NSAID): Secondary | ICD-10-CM | POA: Insufficient documentation

## 2014-07-15 DIAGNOSIS — H9201 Otalgia, right ear: Secondary | ICD-10-CM | POA: Diagnosis present

## 2014-07-15 DIAGNOSIS — R011 Cardiac murmur, unspecified: Secondary | ICD-10-CM | POA: Diagnosis not present

## 2014-07-15 DIAGNOSIS — Z79899 Other long term (current) drug therapy: Secondary | ICD-10-CM | POA: Diagnosis not present

## 2014-07-15 MED ORDER — NAPROXEN 500 MG PO TABS: 500.0000 mg | ORAL_TABLET | Freq: Two times a day (BID) | ORAL | Status: AC

## 2014-07-15 MED ORDER — AMOXICILLIN-POT CLAVULANATE 875-125 MG PO TABS: 1.0000 | ORAL_TABLET | Freq: Two times a day (BID) | ORAL | Status: AC

## 2014-07-15 MED ORDER — HYDROCODONE-ACETAMINOPHEN 5-325 MG PO TABS
2.0000 | ORAL_TABLET | Freq: Once | ORAL | Status: AC
  Administered 2014-07-15: 2 via ORAL
  Filled 2014-07-15: qty 2

## 2014-07-15 MED ORDER — PREDNISONE 20 MG PO TABS
60.0000 mg | ORAL_TABLET | Freq: Once | ORAL | Status: AC
  Administered 2014-07-15: 60 mg via ORAL
  Filled 2014-07-15: qty 3

## 2014-07-15 NOTE — Discharge Instructions (Signed)
Is follow the directions provided. Use the resources guide to establish care with a primary care doctor to ensure you're getting better. Take the antibiotics as directed. Use the naproxen twice a day for pain and inflammation. Don't hesitate to return for any new, worsening, or concerning symptoms.  SEEK IMMEDIATE MEDICAL CARE IF:  You or your child are becoming sicker.  Pain or fever relief is not obtained with medicine.  You or your child's symptoms (pain, fever, or irritability) do not improve within 24 to 48 hours or as instructed.  Severe pain suddenly stops hurting. This may indicate a ruptured eardrum.  You or your children develop new problems such as severe headaches, stiff neck, difficulty swallowing, or swelling of the face or around the ear.    Emergency Department Resource Guide 1) Find a Doctor and Pay Out of Pocket Although you won't have to find out who is covered by your insurance plan, it is a good idea to ask around and get recommendations. You will then need to call the office and see if the doctor you have chosen will accept you as a new patient and what types of options they offer for patients who are self-pay. Some doctors offer discounts or will set up payment plans for their patients who do not have insurance, but you will need to ask so you aren't surprised when you get to your appointment.  2) Contact Your Local Health Department Not all health departments have doctors that can see patients for sick visits, but many do, so it is worth a call to see if yours does. If you don't know where your local health department is, you can check in your phone book. The CDC also has a tool to help you locate your state's health department, and many state websites also have listings of all of their local health departments.  3) Find a Walk-in Clinic If your illness is not likely to be very severe or complicated, you may want to try a walk in clinic. These are popping up all over the  country in pharmacies, drugstores, and shopping centers. They're usually staffed by nurse practitioners or physician assistants that have been trained to treat common illnesses and complaints. They're usually fairly quick and inexpensive. However, if you have serious medical issues or chronic medical problems, these are probably not your best option.  No Primary Care Doctor: - Call Health Connect at  [phone] - they can help you locate a primary care doctor that  accepts your insurance, provides certain services, etc. - Physician Referral Service- [phone]  Chronic Pain Problems: Organization         Address  Phone   Notes  [facility]  [phone] Patients need to be referred by their primary care doctor.   Medication Assistance: Organization         Address  Phone   Notes  [place] [facility] [address] [place] [place], [place] [address] [phone] --Must be a resident of [facility] -- Must have NO insurance coverage whatsoever (no Medicaid/ Medicare, etc.) -- The pt. MUST have a primary care doctor that directs their care regularly and follows them in the community   [facility]ist  [phone]   [employer]  [phone]    Agencies that provide inexpensive medical care: Organization         Address  Phone   Notes  [facility]  [phone]   Patrcia Dolly  [facility] Internal Medicine    [phone]   [facility] [address] [place], [place] [address] [phone]   [facility] [address], [place] [phone]   Planned Parenthood    [phone]   [facility]    [phone]   [facility]  [address], [place] Phone:  [phone], Fax:  [phone] Hours of Operation:  9 am - 6 pm, M-F.  Also accepts Medicaid/Medicare and self-pay.  [facility]  [address], [place] Phone: [phone], Fax: [phone]. Hours of Operation:  8:30 am - 5:30 pm, M-F.  Also accepts Medicaid and self-pay.  [facility] [place] [address], [place] Phone: [phone]   [facility] [address] Natasha Bence [employer], [place] [phone], Ext. [phone] Mondays & Thursdays: 7-9 AM.  First 15 patients are seen on a first come, first serve basis.    Medicaid-accepting [facility] Providers:  Organization         Address  Phone   Notes  [facility] [address],  [phone] Also accepts self-pay patients.  [facility] [address] Laurell Josephs [place], [place]  [phone]   [facility] [address], [place] [phone]   [facility] [address], [place] [phone]   Renaye Rakers [address], [place]   [phone] Only accepts [place] Access [place] patients after they have their name applied to their card.   Self-Pay (no insurance) in [facility]:  Organization         Address  Phone   Notes  Sickle Cell Patients, [facility] Medicine [address] [place], [place] [phone]   [facility] [address] [place], [place] [phone]   [facility] [place]  [address], [place] [phone]   [facility]/Dr. Osei-Bonsu  [address], [place] or [address], [place] [phone] Phone number for both [place] and [place] locations is the same.  [facility] [address] [place] [phone]   [facility] [address] [place] or [address] [phone]   [facility] [address], [place] [phone], phone; [phone], fax Sees patients 1st and 3rd Saturday of every month.  Must  not qualify for public or private insurance (i.e. Medicaid, Medicare, [place] Health Choice, Veterans' Benefits)  Household income should be no more than 200% of the poverty level The clinic cannot treat you if you are pregnant or think you are pregnant  Sexually transmitted diseases are not treated at the clinic.    Dental Care: Organization         Address  Phone  Notes  [facility] Department of [facility] [address] [place], [place] [phone] Accepts children up to age 64 who are enrolled in [place] or [place] Health Choice; pregnant women with a Medicaid card; and children who have applied for Medicaid or [place] Health Choice, but were declined, whose parents can pay a reduced fee at time of service.  [facility]  [address], [employer]  [place] [phone] Accepts children up to age 9 who are enrolled in Medicaid or [place] Health Choice; pregnant women with a Medicaid card; and children who have applied for Medicaid or  Health Choice, but were declined, whose parents can pay a reduced fee at time of service.  [facility]  [address] [place], [place] [phone] Patients are seen by appointment only. Walk-ins are not accepted. [facility] will see patients 42 years of age and older. Monday - Tuesday (8am-5pm) Most Wednesdays (8:30-5pm) $30 per visit, cash only  [facility] Access [facility]  [address], [facility] [phone] Patients are seen by appointment only. Walk-ins are not accepted. [facility] will see patients 45 years of age and older. One Wednesday Evening (Monthly: Volunteer Based).  $30 per visit, cash only  [employer]  [phone] for adults; Children under age 35, call Graduate Pediatric Dentistry at [phone]. Children aged 36-14, please call [phone] to request a pediatric application.  Dental services are  provided in all areas of dental care including fillings, crowns and bridges, complete and partial dentures, implants, gum treatment, root canals, and extractions. Preventive care is also provided. Treatment is provided to both adults and children. Patients are selected via a lottery and there is often a waiting list.   [facility] [address] [place]  [phone] [email or web address]   [facility] [address] [place], [place] [phone], Ext. [phone] Second and Fourth Thursday of each month, opens at 6:30 AM; Clinic ends at 9 AM.  Patients are seen on a first-come first-served basis, and a limited number are seen during each clinic.   [facility]  [address] [place], [place] [phone]   Eligibility Requirements You must have lived in [place], [place], or [place] counties for at least the last three months.   You cannot be eligible for state or federal sponsored [place], including [employer], [place], or [employer].   You generally cannot be eligible for healthcare insurance through your employer.    How to apply: Eligibility screenings are held every Tuesday and Wednesday afternoon from 1:00 pm until 4:00 pm. You do not need an appointment for the interview!  [facility] [address] [place], [place] [phone]   [facility] Health Department  [phone]   [facility] Health Department  [phone]   [facility] Health Department  [phone]    Behavioral Health Resources in the Community: Intensive Outpatient Programs Organization         Address  Phone  Notes  [facility] Services [address], [place], [place] [phone]   [facility] Outpatient [address] [place], [place] [phone]   [facility] [address] [place], [place]  [phone]   [facility] [address]  [place], [place]  [phone] or [phone]   Substance Abuse Resources Organization         Address  Phone  Notes  [facility]  [phone]   [facility]  [phone]   [facility]  [phone]   Floydene Flock  [phone]   Residential & Outpatient Substance Abuse Program  [phone]   Psychological Services Organization         Address  Phone  Notes  [facility]  [phone]   [facility]  [phone]  [place] [address] [place] or [phone]    Mobile Crisis Teams Organization         Address  Phone  Notes  [facility], Mobile Crisis Care Unit  [phone]   [facility]  [address] [place], [place]   [place] Levels [address], [place] [phone]    Self-Help/Support Groups Organization         Address  Phone             Notes  [place]. of [place] - variety of support groups  [place] Call for more information  Narcotics Anonymous (NA), [facility] [address], [employer] [place]  2 meetings at this location   Special educational needs teacher         Address  Phone  Notes  [facility] [place],    [place]  [phone]   [facility]  [address] [place] [phone], [place], [place]   Monona Derby, [place] [phone] Admissions: 8am-3pm M-F  Incentives Substance [place] [address],    [place], [place] [phone]   [facility] [address] [place], [place], [place]   [facility] [address],  [place], [place]   [facility] - Intensive Outpatient [address], [place], [place]   [facility]  [place], [place] [phone] or  [phone]   [facility] ([facility]) [address], [place], [place] Accepts Medicaid  [facility] [place] [address],  [place] [phone] Substance Abuse/Addiction Treatment   [facility] Organization         Address  Phone  Notes  [facility]  [phone]   Domenic Schwab, PhD [address] Arlis Port[place], [place]   [phone] or [phone]   [place], [place] [phone]   [facility] [address] [place], [place] [phone] Insurance/Medicaid/sponsorship through [facility] and Families [address], [address] [place]                                    [place], [place] [phone] [place] [address], [place] [phone]    Dr. Adele Schilder  [phone]   [facility] Dept. 1) [address], [place] 2) [place] 3)  [address], [place] [phone]  [phone]   [place] [phone] or [phone] (After Hours)

## 2014-07-15 NOTE — ED Provider Notes (Signed)
CSN: [id number]     Arrival date & time 07/15/14  1114 History  This chart was scribed for non-physician practitioner, Donetta Potts, NP working with Geoffery Lyons, MD, by Jarvis Morgan, ED Scribe. This patient was seen in room TR05C/TR05C and the patient's care was started at 12:45 PM.    Chief Complaint  Patient presents with  . Otalgia    The history is provided by the patient. No language interpreter was used.    HPI Comments: Ashley Brown is a 29 y.o. female with no PMH who presents to the Emergency Department complaining of constant, moderate, right otalgia for 2 days. Pt also reports nasal congestion, rhinorrhea and sinus pressure for 10. She is having an associated HA, tinnitus, and nausea with the otalgia. She denies any ear drainage. She denies any dental pain, hearing loss, sore throat, fever, vomiting or diarrhea.    Past Medical History  Diagnosis Date  . Bronchitis   . Heart murmur    History reviewed. No pertinent past surgical history. History reviewed. No pertinent family history. History  Substance Use Topics  . Smoking status: Never Smoker   . Smokeless tobacco: Never Used  . Alcohol Use: Yes     Comment: occasional    OB History    Gravida Para Term Preterm AB TAB SAB Ectopic Multiple Living   0 0 0 0 0 4     Review of Systems  Constitutional: Negative for fever and chills.  HENT: Positive for congestion (initially but has now resolved), ear pain, rhinorrhea (initially but now resolved) and tinnitus. Negative for dental problem and hearing loss.   Gastrointestinal: Positive for nausea. Negative for vomiting and diarrhea.  Neurological: Positive for headaches.    Allergies  Other  Home Medications   Prior to Admission medications   Medication Sig Start Date End Date Taking? Authorizing Provider  naproxen (NAPROSYN) 500 MG tablet Take 1 tablet (500 mg total) by mouth 2 (two) times daily. 12/12/13   Lyanne Co, MD  Norgestimate-Ethinyl  Estradiol Triphasic 0.18/0.215/0.25 MG-25 MCG tab Take 1 tablet by mouth daily. 09/03/13   Vale Haven, MD   Triage Vitals: BP 136/102 mmHg  Pulse 79  Temp(Src) 98.3 F (36.8 C) (Oral)  Resp 18  Ht  (1.626 m)  Wt 180 lb (81.647 kg)  BMI 30.88 kg/m2  SpO2 100%  Physical Exam  Constitutional: She is oriented to person, place, and time. She appears well-developed and well-nourished. No distress.  HENT:  Head: Normocephalic and atraumatic.  Right Ear: Ear canal normal. Tympanic membrane is bulging.  Left Ear: Tympanic membrane, external ear and ear canal normal.  Nose: Rhinorrhea present. Right sinus exhibits maxillary sinus tenderness and frontal sinus tenderness. Left sinus exhibits maxillary sinus tenderness and frontal sinus tenderness.  Mouth/Throat: No oropharyngeal exudate.  Eyes: Conjunctivae and EOM are normal.  Neck: Neck supple. No tracheal deviation present.  Cardiovascular: Normal rate.   Pulmonary/Chest: Effort normal. No respiratory distress.  Abdominal: Soft. There is no tenderness.  Musculoskeletal: Normal range of motion.  Lymphadenopathy:       Head (right side): Posterior auricular (mild) adenopathy present.  Neurological: She is alert and oriented to person, place, and time.  Skin: Skin is warm and dry.  Psychiatric: She has a normal mood and affect. Her behavior is normal.  Nursing note and vitals reviewed.   ED Course  Procedures (including critical care time)  DIAGNOSTIC STUDIES: Oxygen Saturation is 100% on RA,  normal by my interpretation.    COORDINATION OF CARE: 12:51 PM- Will order Vicodin and prednisone.  Pt advised of plan for treatment and pt agrees.   Labs Review Labs Reviewed - No data to display  Imaging Review No results found.   EKG Interpretation None      MDM   Final diagnoses:  Acute pansinusitis, recurrence not specified     29 yo with sinus pressure, purulent nasal discharge and ear pain x 10 days. Concern for  acute bacterial rhinosinusitis.  Patient discharged with Augmentin.  Instructions given for warm saline nasal wash. Pt is well-appearing, in no acute distress and vital signs are stable.  They appear safe to be discharged.  Discharge include follow-up with their PCP.  Return precautions provided.    I personally performed the services described in this documentation, which was scribed in my presence. The recorded information has been reviewed and is accurate.  Filed Vitals:   07/15/14 1142 07/15/14 1259  BP: 136/102 120/82  Pulse: 79 74  Temp: 98.3 F (36.8 C)   TempSrc: Oral   Resp: 18 18  Height:  (1.626 m)   Weight: 180 lb (81.647 kg)   SpO2: 100% 100%   Meds given in ED:  Medications  HYDROcodone-acetaminophen (NORCO/VICODIN) 5-325 MG per tablet 2 tablet (2 tablets Oral Given 07/15/14 1301)  predniSONE (DELTASONE) tablet 60 mg (60 mg Oral Given 07/15/14 1301)    Discharge Medication List as of 07/15/2014  1:16 PM    START taking these medications   Details  amoxicillin-clavulanate (AUGMENTIN) 875-125 MG per tablet Take 1 tablet by mouth every 12 (twelve) hours., Starting 07/15/2014, Until Discontinued, Print    !! naproxen (NAPROSYN) 500 MG tablet Take 1 tablet (500 mg total) by mouth 2 (two) times daily., Starting 07/15/2014, Until Discontinued, Print     !! - Potential duplicate medications found. Please discuss with provider.         Harle Battiest, NP 07/15/14 1610  Geoffery Lyons, MD 07/16/14 [phone]

## 2014-07-15 NOTE — ED Notes (Signed)
Pt reports that she has been having right ear pain.

## 2014-07-15 NOTE — ED Notes (Signed)
Declined W/C at D/C and was escorted to lobby by RN. 

## 2014-07-16 ENCOUNTER — Encounter (HOSPITAL_COMMUNITY): Payer: Self-pay

## 2014-07-16 ENCOUNTER — Emergency Department (HOSPITAL_COMMUNITY)
Admission: EM | Admit: 2014-07-16 | Discharge: 2014-07-16 | Disposition: A | Discharge status: Home / Self Care | Payer: Medicaid Other | Attending: Emergency Medicine | Admitting: Emergency Medicine

## 2014-07-16 DIAGNOSIS — Z79899 Other long term (current) drug therapy: Secondary | ICD-10-CM | POA: Diagnosis not present

## 2014-07-16 DIAGNOSIS — J3489 Other specified disorders of nose and nasal sinuses: Secondary | ICD-10-CM | POA: Insufficient documentation

## 2014-07-16 DIAGNOSIS — R011 Cardiac murmur, unspecified: Secondary | ICD-10-CM | POA: Diagnosis not present

## 2014-07-16 DIAGNOSIS — R1110 Vomiting, unspecified: Secondary | ICD-10-CM | POA: Diagnosis not present

## 2014-07-16 DIAGNOSIS — H9201 Otalgia, right ear: Secondary | ICD-10-CM | POA: Diagnosis not present

## 2014-07-16 DIAGNOSIS — H9311 Tinnitus, right ear: Secondary | ICD-10-CM | POA: Insufficient documentation

## 2014-07-16 DIAGNOSIS — Z3202 Encounter for pregnancy test, result negative: Secondary | ICD-10-CM | POA: Insufficient documentation

## 2014-07-16 DIAGNOSIS — R1084 Generalized abdominal pain: Secondary | ICD-10-CM | POA: Insufficient documentation

## 2014-07-16 DIAGNOSIS — Z793 Long term (current) use of hormonal contraceptives: Secondary | ICD-10-CM | POA: Diagnosis not present

## 2014-07-16 DIAGNOSIS — Z791 Long term (current) use of non-steroidal anti-inflammatories (NSAID): Secondary | ICD-10-CM | POA: Diagnosis not present

## 2014-07-16 LAB — URINE MICROSCOPIC-ADD ON

## 2014-07-16 LAB — URINALYSIS, ROUTINE W REFLEX MICROSCOPIC
BILIRUBIN URINE: NEGATIVE
Glucose, UA: NEGATIVE mg/dL
KETONES UR: 40 mg/dL — AB
Leukocytes, UA: NEGATIVE
NITRITE: NEGATIVE
PH: 6.5 (ref 5.0–8.0)
PROTEIN: NEGATIVE mg/dL
Specific Gravity, Urine: 1.025 (ref 1.005–1.030)
Urobilinogen, UA: 1 mg/dL (ref 0.0–1.0)

## 2014-07-16 LAB — PREGNANCY, URINE: Preg Test, Ur: NEGATIVE

## 2014-07-16 MED ORDER — ONDANSETRON HCL 4 MG PO TABS
4.0000 mg | ORAL_TABLET | Freq: Once | ORAL | Status: AC
  Administered 2014-07-16: 4 mg via ORAL
  Filled 2014-07-16: qty 1

## 2014-07-16 MED ORDER — SODIUM CHLORIDE 0.9 % IV BOLUS (SEPSIS)
1000.0000 mL | Freq: Once | INTRAVENOUS | Status: AC
  Administered 2014-07-16: 1000 mL via INTRAVENOUS

## 2014-07-16 MED ORDER — AMOXICILLIN-POT CLAVULANATE 875-125 MG PO TABS
1.0000 | ORAL_TABLET | Freq: Once | ORAL | Status: AC
  Administered 2014-07-16: 1 via ORAL
  Filled 2014-07-16: qty 1

## 2014-07-16 NOTE — Discharge Instructions (Signed)
°Emergency Department Resource Guide °1) Find a Doctor and Pay Out of Pocket °Although you won't have to find out who is covered by your insurance plan, it is a good idea to ask around and get recommendations. You will then need to call the office and see if the doctor you have chosen will accept you as a new patient and what types of options they offer for patients who are self-pay. Some doctors offer discounts or will set up payment plans for their patients who do not have insurance, but you will need to ask so you aren't surprised when you get to your appointment. ° °2) Contact Your Local Health Department °Not all health departments have doctors that can see patients for sick visits, but many do, so it is worth a call to see if yours does. If you don't know where your local health department is, you can check in your phone book. The CDC also has a tool to help you locate your state's health department, and many state websites also have listings of all of their local health departments. ° °3) Find a Walk-in Clinic °If your illness is not likely to be very severe or complicated, you may want to try a walk in clinic. These are popping up all over the country in pharmacies, drugstores, and shopping centers. They're usually staffed by nurse practitioners or physician assistants that have been trained to treat common illnesses and complaints. They're usually fairly quick and inexpensive. However, if you have serious medical issues or chronic medical problems, these are probably not your best option. ° °No Primary Care Doctor: °- Call [facility] at  832-8000 - they can help you locate a primary care doctor that  accepts your insurance, provides certain services, etc. °- Physician Referral Service- 1-800-533-3463 ° °Chronic Pain Problems: °Organization         Address  Phone   Notes  °[facility]  (336) 297-2271 Patients need to be referred by their primary care doctor.  ° °Medication  Assistance: °Organization         Address  Phone   Notes  °[place] [facility] [address] °[place], [place] [address] (336) 641-8030 --Must be a resident of [place] °-- Must have NO insurance coverage whatsoever (no Medicaid/ Medicare, etc.) °-- The pt. MUST have a primary care doctor that directs their care regularly and follows them in the community °  °[facility]  (866) 331-1348   °[employer]  (888) 892-1162   ° °Agencies that provide inexpensive medical care: °Organization         Address  Phone   Notes  °[facility]  (336) 832-8035   °[facility]    (336) 832-7272   °[facility] [address] °[place], [place] [address] (336) 832-4777   °[facility] [address], °[place] (336) 271-4999   °[facility]    (336) 373-0678   °[facility]    (336) 272-1050   °[facility] ° [address], [place] Phone:  (336) 832-4444, Fax:  (336) 832-4440 Hours of Operation:  9 am - 6 pm, M-F.  Also accepts Medicaid/Medicare and self-pay.  °[facility] ° [address], [place] Phone: (336) 832-3150, Fax: (336) 832-3151. Hours of Operation:  8:30 am - 5:30 pm, M-F.  Also accepts Medicaid and self-pay.  °[facility] [place] [address], [place] Phone: (336) 878-6027   °[facility] [address], [place], [place] (336)723-1848, Ext. 123 Mondays & Thursdays: 7-9 AM.  First 15 patients are seen on a first come, first serve basis. °  ° °Medicaid-accepting [place] Providers: ° °Organization         Address  Phone   Notes  °[facility] [address], [place] (336) 641-2100 Also accepts self-pay patients.  °[facility] [address], [place] ° (336) 856-9996   °[facility] [address], [place]  (336) 288-8857   °[facility] [address] [place] [address], [place] (336) 299-7000   °Veita Bland [address], [place]  ° (336) 373-1557 Only accepts [place] Access Medicaid patients after they have their name applied to their card.  ° °Self-Pay (no insurance) in [place]: ° °Organization         Address  Phone   Notes  °Sickle Cell Patients, [facility] [address], [place] (336) 832-1970   °[facility] [address], [place] (336) 832-4400   °[facility] ° [address], [place] (336) 992-4800   °[facility]/Dr. Osei-Bonsu ° [address] [place] [address], [place] or [address], [place] (336) 841-8500 Phone number for both [place] and [place] locations is the same.  °[facility] [address], [place] (336) 299-0000   °[facility] [address] [place] [address], [place] or [address] (336) 852-7530 °(336) 878-2260   °[facility] [address], [place] (336) 350-1642, phone; (336) 294-5005, fax Sees patients 1st and 3rd Saturday of every month.  Must not qualify for public or private insurance (i.e. Medicaid, Medicare, [place] Health Choice, Veterans' Benefits) • Household income should be no more than 200% of the poverty level •The clinic cannot treat you if you are pregnant or think you are pregnant • Sexually transmitted diseases are not treated at the clinic.  ° ° °Dental Care: °Organization         Address  Phone  Notes  °[place] [facility] [address], [place] (336) 641-6152 Accepts children up to age 21 who are enrolled in Medicaid or [place] Health Choice; pregnant women with a Medicaid card; and children who have applied for Medicaid or [place] Health Choice, but were declined, whose parents can pay a reduced fee at time of service.  °[place]  [facility] [place]  [address], [place] (336) 641-7733 Accepts children up to age 21 who are enrolled in Medicaid or [place] Health Choice; pregnant women with a Medicaid card; and children who have applied for Medicaid or  Health Choice, but were declined, whose parents can pay a reduced fee at time of service.  °[facility] ° [address],  (336) 641-4533 Patients are seen by appointment only. Walk-ins are not accepted. [facility] will see patients 18 years of age and older. °Monday - Tuesday (8am-5pm) °Most Wednesdays (8:30-5pm) °$30 per visit, cash only  °[facility] ° [address], [place] (336) 641-4533 Patients are seen by appointment only. Walk-ins are not accepted. [facility] will see patients 18 years of age and older. °One   Wednesday Evening (Monthly: Volunteer Based).  $30 per visit, cash only  °[facility]  (919) 537-3737 for adults; Children under age 4, call Graduate Pediatric Dentistry at (919) [phone]. Children aged 4-14, please call (919) [phone] to request a pediatric application. ° Dental services are provided in all areas of dental care including fillings, crowns and bridges, complete and partial dentures, implants, gum treatment, root canals, and extractions. Preventive care is also provided. Treatment is provided to both adults and children. °Patients are selected via a lottery and there is often a waiting list. °  °[facility] [address], °[place] ° (336) 763-8833 [email or web address] °  °[facility] [address], [place], [place] (336)723-1848, Ext. 123 Second and Fourth Thursday of each month, opens at 6:30 AM; Clinic ends at 9 AM.  Patients are seen on a first-come first-served basis, and a limited number are seen during each clinic.  ° °[facility] ° [address], [place], [place] (336) 723-7904    Eligibility Requirements °You must have lived in [place], [place], or [place] counties for at least the last three months. °  You cannot be eligible for state or federal sponsored healthcare insurance, including Veterans Administration, Medicaid, or Medicare. °  You generally cannot be eligible for healthcare insurance through your employer.  °  How to apply: °Eligibility screenings are held every Tuesday and Wednesday afternoon from 1:00 pm until 4:00 pm. You do not need an appointment for the interview!  °[facility] [address], [place], [place] 336-631-2330   °[facility]  336-342-8273   °[facility]  336-703-3100   °[facility]  336-570-6415   ° °Behavioral Health Resources in the Community: °Intensive Outpatient Programs °Organization         Address  Phone  Notes  °[facility] [address], [place], [place] 336-878-6098   °[facility] [address], [place], [place] 336-832-9800   °[facility] [address], [place], [place] ° 336-882-2125   °[facility] [address],  °[place], [place] 1-800-853-5163 or 336-641-4981   °Substance Abuse Resources °Organization         Address  Phone  Notes  °[facility]  336-882-2125   °[facility]  336-784-9470   °[facility]  336-285-9073   °[facility]  336-845-3988   °Residential & Outpatient Substance Abuse Program  1-800-659-3381   °Psychological Services °Organization         Address  Phone  Notes  °[facility]  336- 832-9600   °[facility]  336- 378-7881   °[facility] [address], [place] 1-800-853-5163 or 336-641-4981   ° °Mobile Crisis Teams °Organization         Address  Phone  Notes  °[facility], Mobile Crisis Care Unit  1-877-626-1772   °Assertive °[facility] ° [address]  [place], [place] 336-834-9664   °Sharon DeEsch [address] °[place] 336-554-5454   ° °Self-Help/Support Groups °Organization         Address  Phone             Notes  °[facility]  - variety of support groups  336- 373-1402 Call for more information  °Narcotics Anonymous (NA), [facility] [address], °[place]  2 meetings at this location  ° °  Residential Treatment Programs °Organization         Address  Phone  Notes  °[facility] [address],    °[place]  1-866-801-8205   °[facility] ° [address], [place], [place] 704-293-8524   °[facility] [address], [place] 336-845-3988 Admissions: 8am-3pm M-F  °[facility] [address]    °[place], [place] 336-841-1104   °[facility] [address], [place], [place] 336-379-7146   °[facility] [address]  °[place], [place] 336-285-9073   °[facility] Intensive Outpatient [address], [place], [place] 336-852-3033   °[facility] [address]  °[place], [place] 1-877-615-2722 or 336-784-9470   °[facility] [address] [place], [place] 336-227-7417 Accepts Medicaid  °[facility] [address]  °[place] 1-800-659-3381 Substance Abuse/Addiction Treatment  ° °[place] Behavioral Health Resources °Organization         Address  Phone  Notes  °[facility]  (888) 581-9988   °Julie Brannon, PhD [address] [place], [place]   (336) 349-5553 or (336) 951-0000   °[facility]   [address] °[place], [place] (336) 349-4454   °[facility] [address], [place], [place] (336) 342-8316 Insurance/Medicaid/sponsorship through [facility]  °[facility] [address]                                    [place], [place] (336) 342-8316 Therapy/tele-psych/case    °[facility] [address]  ° [place], [place] (336) 349-2233    °Dr. Arfeen  (336) 349-4544   °[facility] [place]  [employer] [place] [facility] 1) [address],  °2) [address], [place] °3)  [address], [place] (336) 349-3220 °(336) 342-7768 ° °(336) 342-8140   °[place] [facility] (336) 342-1394 or (336) 342-3537 (After Hours)    ° ° °

## 2014-07-16 NOTE — ED Notes (Signed)
Unable to void at this time.  Nurse notified.

## 2014-07-16 NOTE — ED Notes (Signed)
[facility]- Pt was seen and treated here yesterday for sinus infection. She was given Vicodin and Prednisone. She reports she has been nauseous with vomiting since 2pm yesterday after being discharged. Pt reports abd tenderness. Reports she has been unable to eat. Vital signs stable with EMS, CBG: 129.

## 2014-07-16 NOTE — ED Provider Notes (Signed)
CSN: [id number]     Arrival date & time 07/16/14  0805 History   First MD Initiated Contact with Patient 07/16/14 [id number]     Chief Complaint  Patient presents with  . Abdominal Pain  . Recurrent Sinusitis     (Consider location/radiation/quality/duration/timing/severity/associated sxs/prior Treatment) Patient is a 29 y.o. female presenting with ear pain. The history is provided by the patient.  Otalgia Location:  Right Quality:  Aching and pressure Severity:  Moderate Onset quality:  Gradual Duration:  3 days Timing:  Constant Progression:  Worsening Chronicity:  New Context comment:  Diagnosed with sinus infection yesterday Relieved by:  None tried (has not filled rx for augmentin yet) Worsened by:  Swallowing Associated symptoms: abdominal pain, rhinorrhea, tinnitus and vomiting   Associated symptoms: no cough, no diarrhea, no fever, no headaches, no rash and no sore throat     Past Medical History  Diagnosis Date  . Bronchitis   . Heart murmur    History reviewed. No pertinent past surgical history. History reviewed. No pertinent family history. History  Substance Use Topics  . Smoking status: Never Smoker   . Smokeless tobacco: Never Used  . Alcohol Use: Yes     Comment: occasional    OB History    Gravida Para Term Preterm AB TAB SAB Ectopic Multiple Living   0 0 0 0 0 4     Review of Systems  Constitutional: Negative for fever, chills, diaphoresis, activity change, appetite change and fatigue.  HENT: Positive for ear pain, rhinorrhea, sinus pressure and tinnitus. Negative for facial swelling, sore throat, trouble swallowing and voice change.   Eyes: Negative for photophobia, pain and visual disturbance.  Respiratory: Negative for cough, shortness of breath, wheezing and stridor.   Cardiovascular: Negative for chest pain, palpitations and leg swelling.  Gastrointestinal: Positive for vomiting and abdominal pain. Negative for nausea, diarrhea,  constipation and anal bleeding.  Endocrine: Negative.   Genitourinary: Negative for dysuria, vaginal bleeding, vaginal discharge and vaginal pain.  Musculoskeletal: Negative for myalgias, back pain and arthralgias.  Skin: Negative.  Negative for rash.  Allergic/Immunologic: Negative.   Neurological: Negative for dizziness, tremors, syncope, weakness and headaches.  Psychiatric/Behavioral: Negative for suicidal ideas, sleep disturbance and self-injury.  All other systems reviewed and are negative.     Allergies  Other  Home Medications   Prior to Admission medications   Medication Sig Start Date End Date Taking? Authorizing Provider  ferrous sulfate 325 (65 FE) MG tablet Take 325 mg by mouth daily with breakfast.   Yes Historical Provider, MD  amoxicillin-clavulanate (AUGMENTIN) 875-125 MG per tablet Take 1 tablet by mouth every 12 (twelve) hours. Patient not taking: Reported on 07/16/2014 07/15/14   Harle Battiest, NP  naproxen (NAPROSYN) 500 MG tablet Take 1 tablet (500 mg total) by mouth 2 (two) times daily. Patient not taking: Reported on 07/16/2014 12/12/13   Lyanne Co, MD  naproxen (NAPROSYN) 500 MG tablet Take 1 tablet (500 mg total) by mouth 2 (two) times daily. Patient not taking: Reported on 07/16/2014 07/15/14   Harle Battiest, NP  Norgestimate-Ethinyl Estradiol Triphasic 0.18/0.215/0.25 MG-25 MCG tab Take 1 tablet by mouth daily. Patient not taking: Reported on 07/16/2014 09/03/13   [place]ale [place], MD   BP 109/64 mmHg  Pulse 74  Temp(Src) 98.2 F (36.8 C) (Oral)  Resp 18  SpO2 100%  LMP 07/16/2014 Physical Exam  Constitutional: She is oriented to person, place, and time. She appears well-developed and  well-nourished. No distress.  Patient appears very well non-toxic  HENT:  Head: Normocephalic and atraumatic.  Right Ear: External ear normal. No mastoid tenderness. Tympanic membrane is bulging. Tympanic membrane is not erythematous.  Left Ear: External ear normal. No  mastoid tenderness. Tympanic membrane is not erythematous and not bulging.  Nose: Right sinus exhibits maxillary sinus tenderness and frontal sinus tenderness. Left sinus exhibits maxillary sinus tenderness and frontal sinus tenderness.  Mouth/Throat: Uvula is midline and oropharynx is clear and moist. No oropharyngeal exudate, posterior oropharyngeal edema, posterior oropharyngeal erythema or tonsillar abscesses.  Eyes: Conjunctivae and EOM are normal. Pupils are equal, round, and reactive to light. No scleral icterus.  Neck: Normal range of motion. Neck supple. No JVD present. No tracheal deviation present. No thyromegaly present.  Cardiovascular: Normal rate, regular rhythm and intact distal pulses.  Exam reveals no gallop and no friction rub.   No murmur heard. Pulmonary/Chest: Effort normal and breath sounds normal. No respiratory distress. She has no wheezes. She has no rales.  Abdominal: Soft. Bowel sounds are normal. She exhibits no distension. There is tenderness (very mild tenderness diffusely).  Musculoskeletal: Normal range of motion. She exhibits no edema or tenderness.  Neurological: She is alert and oriented to person, place, and time. No cranial nerve deficit. She exhibits normal muscle tone. Coordination normal.  5/5 strength in all 4 extremities. Normal Gait.   Skin: Skin is warm and dry. She is not diaphoretic. No pallor.  Psychiatric: She has a normal mood and affect. She expresses no homicidal and no suicidal ideation. She expresses no suicidal plans and no homicidal plans.  Nursing note and vitals reviewed.   ED Course  Procedures (including critical care time) Labs Review Labs Reviewed  URINALYSIS, ROUTINE W REFLEX MICROSCOPIC - Abnormal; Notable for the following:    Hgb urine dipstick MODERATE (*)    Ketones, ur 40 (*)    All other components within normal limits  URINE MICROSCOPIC-ADD ON - Abnormal; Notable for the following:    Bacteria, UA FEW (*)    All other  components within normal limits  PREGNANCY, URINE    Imaging Review No results found.   EKG Interpretation None      MDM   Final diagnoses:  Otalgia of right ear    The patient  Is a 29 y.o. F who was seen yesterday in the ED for right otalgia and sinus tenderness and discharged with Augmentin. The patient presents today with continued pain as well as emesis, the patient has not yet filled her prescriptions. The patient is afebrile, very well appearing on exam. Exam as above. The patient reports abdominal pain only started after several episodes of emesis and abdomen is soft with no peritoneal signs on exam. Urine pregnancy is negative and UA does not indicate infection. Patient is given IV fluids and Zofran and reports feeling much better. She is able to tolerate PO including her Augmentin wall in the ED. I feel patient is appropriate for discharge home with PCP follow-up, resource guide to establish care with a PCP given, and standard ED return precautions. The patient expresses understanding that she needs to fill her prescription for Augmentin and begin taking it tonight and states she is confident she will be able to do this.  I estimate there is LOW risk for ACUTE APPENDICITIS, BOWEL OBSTRUCTION, CHOLECYSTITIS, DIVERTICULITIS, INCARCERATED HERNIA, MESENTERIC ISCHEMIA, PANCREATITIS, or PERFORATED BOWEL or ULCER, thus I consider the discharge disposition reasonable. Also, there is no evidence or peritonitis,  sepsis or toxicity. We have discussed the diagnosis and risks and agree with discharging home to follow-up with their primary doctor. We also discussed returning to the Emergency Department immediately if new or worsening symptoms occur and have discussed the symptoms which are most concerning (e.g., bloody stool, fever, changing or worsening pain, vomiting) that necessitate immediate return.  Patient seen with attending, Dr. Manus Gunningancour, who oversaw clinical decision making.     Lula OlszewskiMike  Nella Botsford, MD 07/16/14 1056  Glynn OctaveStephen Rancour, MD 07/16/14 [phone]

## 2014-10-17 ENCOUNTER — Emergency Department (HOSPITAL_COMMUNITY)
Admission: EM | Admit: 2014-10-17 | Discharge: 2014-10-17 | Disposition: A | Discharge status: Home / Self Care | Payer: Medicaid Other | Attending: Emergency Medicine | Admitting: Emergency Medicine

## 2014-10-17 ENCOUNTER — Encounter (HOSPITAL_COMMUNITY): Payer: Self-pay | Admitting: Emergency Medicine

## 2014-10-17 DIAGNOSIS — T148XXA Other injury of unspecified body region, initial encounter: Secondary | ICD-10-CM

## 2014-10-17 DIAGNOSIS — S29001A Unspecified injury of muscle and tendon of front wall of thorax, initial encounter: Secondary | ICD-10-CM | POA: Diagnosis present

## 2014-10-17 DIAGNOSIS — Y9289 Other specified places as the place of occurrence of the external cause: Secondary | ICD-10-CM | POA: Diagnosis not present

## 2014-10-17 DIAGNOSIS — R011 Cardiac murmur, unspecified: Secondary | ICD-10-CM | POA: Insufficient documentation

## 2014-10-17 DIAGNOSIS — S29011A Strain of muscle and tendon of front wall of thorax, initial encounter: Secondary | ICD-10-CM | POA: Insufficient documentation

## 2014-10-17 DIAGNOSIS — Y998 Other external cause status: Secondary | ICD-10-CM | POA: Diagnosis not present

## 2014-10-17 DIAGNOSIS — R300 Dysuria: Secondary | ICD-10-CM | POA: Diagnosis not present

## 2014-10-17 DIAGNOSIS — Z8709 Personal history of other diseases of the respiratory system: Secondary | ICD-10-CM | POA: Insufficient documentation

## 2014-10-17 DIAGNOSIS — X58XXXA Exposure to other specified factors, initial encounter: Secondary | ICD-10-CM | POA: Diagnosis not present

## 2014-10-17 DIAGNOSIS — Y9389 Activity, other specified: Secondary | ICD-10-CM | POA: Insufficient documentation

## 2014-10-17 DIAGNOSIS — Z79899 Other long term (current) drug therapy: Secondary | ICD-10-CM | POA: Diagnosis not present

## 2014-10-17 DIAGNOSIS — S3992XA Unspecified injury of lower back, initial encounter: Secondary | ICD-10-CM | POA: Insufficient documentation

## 2014-10-17 LAB — URINALYSIS, ROUTINE W REFLEX MICROSCOPIC
Bilirubin Urine: NEGATIVE
Glucose, UA: NEGATIVE mg/dL
Ketones, ur: NEGATIVE mg/dL
Leukocytes, UA: NEGATIVE
Nitrite: NEGATIVE
PH: 6.5 (ref 5.0–8.0)
Protein, ur: NEGATIVE mg/dL
Specific Gravity, Urine: 1.019 (ref 1.005–1.030)
Urobilinogen, UA: 0.2 mg/dL (ref 0.0–1.0)

## 2014-10-17 LAB — URINE MICROSCOPIC-ADD ON

## 2014-10-17 LAB — POC URINE PREG, ED: Preg Test, Ur: NEGATIVE

## 2014-10-17 MED ORDER — IBUPROFEN 800 MG PO TABS
800.0000 mg | ORAL_TABLET | Freq: Once | ORAL | Status: AC
  Administered 2014-10-17: 800 mg via ORAL
  Filled 2014-10-17: qty 1

## 2014-10-17 NOTE — Discharge Instructions (Signed)
Muscle Strain You can continue to take naproxen (Naprosyn) as directed for pain. Call Dr. Parke SimmersBland to arrange to be seen in the office if not improved in a week. Return if you feel worse for any reason A muscle strain (pulled muscle) happens when a muscle is stretched beyond normal length. It happens when a sudden, violent force stretches your muscle too far. Usually, a few of the fibers in your muscle are torn. Muscle strain is common in athletes. Recovery usually takes 1-2 weeks. Complete healing takes 5-6 weeks.  HOME CARE   Follow the PRICE method of treatment to help your injury get better. Do this the first 2-3 days after the injury:  Protect. Protect the muscle to keep it from getting injured again.  Rest. Limit your activity and rest the injured body part.  Ice. Put ice in a plastic bag. Place a towel between your skin and the bag. Then, apply the ice and leave it on from 15-20 minutes each hour. After the third day, switch to moist heat packs.  Compression. Use a splint or elastic bandage on the injured area for comfort. Do not put it on too tightly.  Elevate. Keep the injured body part above the level of your heart.  Only take medicine as told by your doctor.  Warm up before doing exercise to prevent future muscle strains. GET HELP IF:   You have more pain or puffiness (swelling) in the injured area.  You feel numbness, tingling, or notice a loss of strength in the injured area. MAKE SURE YOU:   Understand these instructions.  Will watch your condition.  Will get help right away if you are not doing well or get worse. Document Released: 04/06/2008 Document Revised: 04/18/2013 Document Reviewed: 01/25/2013 [facility] Patient Information 2015 [place]. This information is not intended to replace advice given to you by your health care provider. Make sure you discuss any questions you have with your health care provider.

## 2014-10-17 NOTE — ED Provider Notes (Signed)
CSN: [id number]     Arrival date & time 10/17/14  [id number] History   First MD Initiated Contact with Patient 10/17/14 [phone]     Chief Complaint  Patient presents with  . Rib Pain   . Back Pain     (Consider location/radiation/quality/duration/timing/severity/associated sxs/prior Treatment) HPI Complains of low back pain and bilateral lower rib pain onset 4 days ago. Pain is worse with squatting or changing positions, improved with remaining still. No fever no cough no shortness of breath no abdominal pain no headache. She treated herself with Naprosyn 3 days ago, without relief. Pain is moderate. Associated symptoms include dysuria 3 days. No incontinence. No feeling of post void residual. Past Medical History  Diagnosis Date  . Bronchitis   . Heart murmur    No past surgical history on file. past surgical history tubal ligation No family history on file. History  Substance Use Topics  . Smoking status: Never Smoker   . Smokeless tobacco: Never Used  . Alcohol Use: Yes     Comment: occasional    OB History    Gravida Para Term Preterm AB TAB SAB Ectopic Multiple Living   4 4 1 3  0 0 0 0 0 4     Review of Systems  Constitutional: Negative.   HENT: Negative.   Respiratory: Negative.   Cardiovascular: Positive for chest pain.       Bilateral rib pain  Gastrointestinal: Negative.   Genitourinary: Positive for dysuria.  Musculoskeletal: Positive for back pain.  Skin: Negative.   Neurological: Negative.   Psychiatric/Behavioral: Negative.   All other systems reviewed and are negative.     Allergies  Other  Home Medications   Prior to Admission medications   Medication Sig Start Date End Date Taking? Authorizing Provider  naproxen (NAPROSYN) 500 MG tablet Take 1 tablet (500 mg total) by mouth 2 (two) times daily. Patient taking differently: Take 500 mg by mouth 2 (two) times daily as needed for mild pain or moderate pain.  07/15/14  Yes Harle BattiestElizabeth Tysinger, NP   Prenat-FePoly-Metf-FA-DHA-DSS (VITAFOL FE+ PO) Take 1 tablet by mouth daily.   Yes Historical Provider, MD  amoxicillin-clavulanate (AUGMENTIN) 875-125 MG per tablet Take 1 tablet by mouth every 12 (twelve) hours. Patient not taking: Reported on 07/16/2014 07/15/14   Harle BattiestElizabeth Tysinger, NP  naproxen (NAPROSYN) 500 MG tablet Take 1 tablet (500 mg total) by mouth 2 (two) times daily. Patient not taking: Reported on 07/16/2014 12/12/13   Azalia BilisKevin Campos, MD  Norgestimate-Ethinyl Estradiol Triphasic 0.18/0.215/0.25 MG-25 MCG tab Take 1 tablet by mouth daily. Patient not taking: Reported on 07/16/2014 09/03/13   Vale HavenKeli L Beck, MD   BP 143/100 mmHg  Temp(Src) 98 F (36.7 C) (Oral)  Resp 18  SpO2 99%  LMP 10/08/2014 Physical Exam  Constitutional: She is oriented to person, place, and time. She appears well-developed and well-nourished.  HENT:  Head: Normocephalic and atraumatic.  Eyes: Conjunctivae are normal. Pupils are equal, round, and reactive to light.  Neck: Neck supple. No tracheal deviation present. No thyromegaly present.  Cardiovascular: Normal rate and regular rhythm.   No murmur heard. Pulmonary/Chest: Effort normal and breath sounds normal. She exhibits tenderness.  Tenderness at chest bilaterally midaxillary lines  Abdominal: Soft. Bowel sounds are normal. She exhibits no distension. There is no tenderness.  Musculoskeletal: Normal range of motion. She exhibits no edema or tenderness.  Entire spine nontender.  Neurological: She is alert and oriented to person, place, and time. No cranial nerve deficit. Coordination  normal.  DTRs symmetric bilaterally at knee jerk ankle jerk and biceps toes or going bilaterally  Skin: Skin is warm and dry. No rash noted.  Psychiatric: She has a normal mood and affect.  Nursing note and vitals reviewed.   ED Course  Procedures (including critical care time) Labs Review Labs Reviewed - No data to display  Imaging Review No results found.   EKG  Interpretation None     10:10 PM reports no relief of discomfort after treatment with ibuprofen. She does not wish further pain medication and feels ready to go home Results for orders placed or performed during the hospital encounter of 10/17/14  Urinalysis, Routine w reflex microscopic  Result Value Ref Range   Color, Urine YELLOW YELLOW   APPearance CLEAR CLEAR   Specific Gravity, Urine 1.019 1.005 - 1.030   pH 6.5 5.0 - 8.0   Glucose, UA NEGATIVE NEGATIVE mg/dL   Hgb urine dipstick TRACE (A) NEGATIVE   Bilirubin Urine NEGATIVE NEGATIVE   Ketones, ur NEGATIVE NEGATIVE mg/dL   Protein, ur NEGATIVE NEGATIVE mg/dL   Urobilinogen, UA 0.2 0.0 - 1.0 mg/dL   Nitrite NEGATIVE NEGATIVE   Leukocytes, UA NEGATIVE NEGATIVE  Urine microscopic-add on  Result Value Ref Range   Squamous Epithelial / LPF RARE RARE   RBC / HPF 0-2 <3 RBC/hpf   Urine-Other MUCOUS PRESENT   POC urine preg, ED (not at [facility])  Result Value Ref Range   Preg Test, Ur NEGATIVE NEGATIVE   No results found.  MDM  Pain felt to be muscular in etiology., Likely muscle strain No evidence of urinary tract infection. She can continue to take Naprosyn as needed. Follow-up with Dr. Parke Simmers if not better in a week Final diagnoses:  None   Diagnosis #1 chest wall pain #2 low back pain #3 dysuria     Doug Sou, MD 10/17/14 1010

## 2014-10-17 NOTE — ED Notes (Signed)
Pt c/o bilateral lower rib pain that radiates to low back. States it is worse on lt side.  Denies injury.  Sx since Monday.  Denies NVD, constipation.  Denies dysuria but states that when she tries to stop her flow of urine, she has a sharp pain.

## 2014-11-11 ENCOUNTER — Emergency Department (HOSPITAL_COMMUNITY)
Admission: EM | Admit: 2014-11-11 | Discharge: 2014-11-11 | Disposition: A | Discharge status: Home / Self Care | Payer: Medicaid Other | Attending: Emergency Medicine | Admitting: Emergency Medicine

## 2014-11-11 ENCOUNTER — Encounter (HOSPITAL_COMMUNITY): Payer: Self-pay | Admitting: Emergency Medicine

## 2014-11-11 DIAGNOSIS — R011 Cardiac murmur, unspecified: Secondary | ICD-10-CM | POA: Insufficient documentation

## 2014-11-11 DIAGNOSIS — J029 Acute pharyngitis, unspecified: Secondary | ICD-10-CM | POA: Insufficient documentation

## 2014-11-11 DIAGNOSIS — Z3202 Encounter for pregnancy test, result negative: Secondary | ICD-10-CM | POA: Diagnosis not present

## 2014-11-11 LAB — RAPID STREP SCREEN (MED CTR MEBANE ONLY): Streptococcus, Group A Screen (Direct): NEGATIVE

## 2014-11-11 LAB — URINALYSIS, ROUTINE W REFLEX MICROSCOPIC
Bilirubin Urine: NEGATIVE
Glucose, UA: NEGATIVE mg/dL
Ketones, ur: NEGATIVE mg/dL
LEUKOCYTES UA: NEGATIVE
Nitrite: NEGATIVE
PROTEIN: NEGATIVE mg/dL
Specific Gravity, Urine: 1.014 (ref 1.005–1.030)
Urobilinogen, UA: 0.2 mg/dL (ref 0.0–1.0)
pH: 6 (ref 5.0–8.0)

## 2014-11-11 LAB — URINE MICROSCOPIC-ADD ON

## 2014-11-11 LAB — PREGNANCY, URINE: PREG TEST UR: NEGATIVE

## 2014-11-11 MED ORDER — AMOXICILLIN-POT CLAVULANATE 875-125 MG PO TABS: 1.0000 | ORAL_TABLET | Freq: Two times a day (BID) | ORAL | Status: AC

## 2014-11-11 MED ORDER — PREDNISONE 20 MG PO TABS
ORAL_TABLET | ORAL | Status: AC
Start: 2014-11-11 — End: ?

## 2014-11-11 MED ORDER — PREDNISONE 20 MG PO TABS
60.0000 mg | ORAL_TABLET | Freq: Once | ORAL | Status: AC
  Administered 2014-11-11: 60 mg via ORAL
  Filled 2014-11-11: qty 3

## 2014-11-11 NOTE — ED Provider Notes (Signed)
CSN: [id number]     Arrival date & time 11/11/14  0941 History   First MD Initiated Contact with Patient 11/11/14 [phone]     Chief Complaint  Patient presents with  . multiple complaints      (Consider location/radiation/quality/duration/timing/severity/associated sxs/prior Treatment) HPI Patient has had problems with a sore throat and hoarse voice starting about 4 days ago. She states she also has had some facial pain over both cheeks. Yesterday she lost her voice. She is also noted some mucus in her throat that occasionally she clears out. She denies she's had any cough or chest congestion. No associated vomiting or abdominal pain. She does report yesterday evening she had several episodes of diarrheal stool. She does have 4 children but she denies that they have been sick recently. Past Medical History  Diagnosis Date  . Bronchitis   . Heart murmur    Past Surgical History  Procedure Laterality Date  . Tubal ligation     No family history on file. History  Substance Use Topics  . Smoking status: Never Smoker   . Smokeless tobacco: Never Used  . Alcohol Use: Yes     Comment: occasional    OB History    Gravida Para Term Preterm AB TAB SAB Ectopic Multiple Living   4 4 1 3  0 0 0 0 0 4     Review of Systems  10 Systems reviewed and are negative for acute change except as noted in the HPI.   Allergies  Other  Home Medications   Prior to Admission medications   Medication Sig Start Date End Date Taking? Authorizing Provider  amoxicillin-clavulanate (AUGMENTIN) 875-125 MG per tablet Take 1 tablet by mouth every 12 (twelve) hours. Patient not taking: Reported on 07/16/2014 07/15/14   Harle BattiestElizabeth Tysinger, NP  amoxicillin-clavulanate (AUGMENTIN) 875-125 MG per tablet Take 1 tablet by mouth 2 (two) times daily. One po bid x 7 days 11/11/14   Arby BarretteMarcy Arlyne Brandes, MD  naproxen (NAPROSYN) 500 MG tablet Take 1 tablet (500 mg total) by mouth 2 (two) times daily. Patient not taking: Reported on  07/16/2014 12/12/13   Azalia BilisKevin Campos, MD  naproxen (NAPROSYN) 500 MG tablet Take 1 tablet (500 mg total) by mouth 2 (two) times daily. Patient not taking: Reported on 11/11/2014 07/15/14   Harle BattiestElizabeth Tysinger, NP  Norgestimate-Ethinyl Estradiol Triphasic 0.18/0.215/0.25 MG-25 MCG tab Take 1 tablet by mouth daily. Patient not taking: Reported on 07/16/2014 09/03/13   Vale HavenKeli L Beck, MD  predniSONE (DELTASONE) 20 MG tablet 2 tabs po daily x 4 days 11/11/14   Arby BarretteMarcy Raeven Pint, MD   BP 131/85 mmHg  Pulse 80  Temp(Src) 98 F (36.7 C) (Oral)  Resp 18  Ht 5\' 4"  (1.626 m)  SpO2 100%  LMP 11/06/2014 Physical Exam  Constitutional: She is oriented to person, place, and time. She appears well-developed and well-nourished. No distress.  HENT:  Head: Normocephalic and atraumatic.  Bilateral tonsils are moderately enlarged. There is a small cryptolith and exudate on the left tonsil. Uvula is midline and symmetric. Dentition is in good condition. Patient has mild facial tenderness to palpation over the zygomas however there is no facial cellulitis or swelling. Mild tenderness to palpation over bilateral tonsillar nodes. No meningismus. No trismus. She does have a hoarse voice that is laryngitic.  Eyes: EOM are normal. Pupils are equal, round, and reactive to light.  Neck: Neck supple.  Cardiovascular: Normal rate, regular rhythm, normal heart sounds and intact distal pulses.   Pulmonary/Chest: Effort normal  and breath sounds normal.  Abdominal: Soft. Bowel sounds are normal. She exhibits no distension. There is no tenderness.  Musculoskeletal: Normal range of motion. She exhibits no edema.  Neurological: She is alert and oriented to person, place, and time. She has normal strength. Coordination normal. GCS eye subscore is 4. GCS verbal subscore is 5. GCS motor subscore is 6.  Skin: Skin is warm, dry and intact.  Psychiatric: She has a normal mood and affect.    ED Course  Procedures (including critical care time) Labs  Review Labs Reviewed  URINALYSIS, ROUTINE W REFLEX MICROSCOPIC - Abnormal; Notable for the following:    Hgb urine dipstick SMALL (*)    All other components within normal limits  RAPID STREP SCREEN  CULTURE, GROUP A STREP  PREGNANCY, URINE  URINE MICROSCOPIC-ADD ON    Imaging Review No results found.   EKG Interpretation None      MDM   Final diagnoses:  Pharyngitis   This patient will be treated for a possible bacterial pharyngitis. The patient is nontoxic. Her airway is widely patent. She will be started on treatment and advised to return if there should be any one worsening or changing symptoms. She does not have any drooling or difficulty taking oral fluids.    Arby Barrette, MD 11/11/14 1147

## 2014-11-11 NOTE — ED Notes (Addendum)
Patient c/o gum swelling, loss of voice, redness to throat, headache, dizziness, and diarrhea. Patient states she started loosing her voice on Friday, diarrhea started on Sunday, and oral swelling started today. Patient has not taken any medications at home for these complaints.

## 2014-11-11 NOTE — Discharge Instructions (Signed)

## 2014-11-13 LAB — CULTURE, GROUP A STREP: STREP A CULTURE: NEGATIVE

## 2015-01-25 ENCOUNTER — Encounter (HOSPITAL_COMMUNITY): Payer: Self-pay | Admitting: Emergency Medicine

## 2015-01-25 ENCOUNTER — Emergency Department (HOSPITAL_COMMUNITY)
Admission: EM | Admit: 2015-01-25 | Discharge: 2015-01-25 | Disposition: A | Discharge status: Home / Self Care | Payer: Medicaid Other | Attending: Emergency Medicine | Admitting: Emergency Medicine

## 2015-01-25 DIAGNOSIS — R202 Paresthesia of skin: Secondary | ICD-10-CM | POA: Insufficient documentation

## 2015-01-25 DIAGNOSIS — Z8709 Personal history of other diseases of the respiratory system: Secondary | ICD-10-CM | POA: Diagnosis not present

## 2015-01-25 DIAGNOSIS — G5601 Carpal tunnel syndrome, right upper limb: Secondary | ICD-10-CM | POA: Insufficient documentation

## 2015-01-25 DIAGNOSIS — R200 Anesthesia of skin: Secondary | ICD-10-CM | POA: Diagnosis present

## 2015-01-25 DIAGNOSIS — R011 Cardiac murmur, unspecified: Secondary | ICD-10-CM | POA: Insufficient documentation

## 2015-01-25 HISTORY — DX: Carpal tunnel syndrome, right upper limb: G56.01

## 2015-01-25 MED ORDER — IBUPROFEN 800 MG PO TABS: 800.0000 mg | ORAL_TABLET | Freq: Three times a day (TID) | ORAL | Status: DC

## 2015-01-25 MED ORDER — IBUPROFEN 800 MG PO TABS
800.0000 mg | ORAL_TABLET | Freq: Once | ORAL | Status: AC
  Administered 2015-01-25: 800 mg via ORAL
  Filled 2015-01-25: qty 1

## 2015-01-25 NOTE — ED Notes (Signed)
Pt reports right arm numbness and tingling which comes and goes over the last 2 weeks. Pt reports that it has gotten worse since last night and will not go away. Pt has hx of carpal tunnel in the right arm. Pt alert x4. NAD at this time.

## 2015-01-25 NOTE — Discharge Instructions (Signed)
Carpal Tunnel Syndrome The carpal tunnel is a narrow area located on the palm side of your wrist. The tunnel is formed by the wrist bones and ligaments. Nerves, blood vessels, and tendons pass through the carpal tunnel. Repeated wrist motion or certain diseases may cause swelling within the tunnel. This swelling pinches the main nerve in the wrist (median nerve) and causes the painful hand and arm condition called carpal tunnel syndrome. CAUSES   Repeated wrist motions.  Wrist injuries.  Certain diseases like arthritis, diabetes, alcoholism, hyperthyroidism, and kidney failure.  Obesity.  Pregnancy. SYMPTOMS   A "pins and needles" feeling in your fingers or hand, especially in your thumb, index and middle fingers.  Tingling or numbness in your fingers or hand.  An aching feeling in your entire arm, especially when your wrist and elbow are bent for long periods of time.  Wrist pain that goes up your arm to your shoulder.  Pain that goes down into your palm or fingers.  A weak feeling in your hands. DIAGNOSIS  Your health care provider will take your history and perform a physical exam. An electromyography test may be needed. This test measures electrical signals sent out by your nerves into the muscles. The electrical signals are usually slowed by carpal tunnel syndrome. You may also need X-rays. TREATMENT  Carpal tunnel syndrome may clear up by itself. Your health care provider may recommend a wrist splint or medicine such as a nonsteroidal anti-inflammatory medicine. Cortisone injections may help. Sometimes, surgery may be needed to free the pinched nerve.  HOME CARE INSTRUCTIONS   Take all medicine as directed by your health care provider. Only take over-the-counter or prescription medicines for pain, discomfort, or fever as directed by your health care provider.  If you were given a splint to keep your wrist from bending, wear it as directed. It is important to wear the splint at  night. Wear the splint for as long as you have pain or numbness in your hand, arm, or wrist. This may take 1 to 2 months.  Rest your wrist from any activity that may be causing your pain. If your symptoms are work-related, you may need to talk to your employer about changing to a job that does not require using your wrist.  Put ice on your wrist after long periods of wrist activity.  Put ice in a plastic bag.  Place a towel between your skin and the bag.  Leave the ice on for 15-20 minutes, 03-04 times a day.  Keep all follow-up visits as directed by your health care provider. This includes any orthopedic referrals, physical therapy, and rehabilitation. Any delay in getting necessary care could result in a delay or failure of your condition to heal. SEEK IMMEDIATE MEDICAL CARE IF:   You have new, unexplained symptoms.  Your symptoms get worse and are not helped or controlled with medicines. MAKE SURE YOU:   Understand these instructions.  Will watch your condition.  Will get help right away if you are not doing well or get worse. Document Released: 06/25/2000 Document Revised: 11/12/2013 Document Reviewed: 05/14/2011 Exit[employer] Patient Information 2015 [employer]. This information is not intended to replace advice given to you by your health care provider. Make sure you discuss any questions you have with your health care provider.  

## 2015-01-25 NOTE — ED Provider Notes (Signed)
CSN: [id number]     Arrival date & time 01/25/15  1324 History   First MD Initiated Contact with Patient 01/25/15 [phone]     Chief Complaint  Patient presents with  . Numbness     (Consider location/radiation/quality/duration/timing/severity/associated sxs/prior Treatment) HPI Ashley Brown is a 29 year old female past medical history of carpal tunnel syndrome who presents the ER complaining of a tingling sensation in her right wrist which radiates proximally into her right arm. Patient reports her symptoms have progressively worsened over the past 2 weeks. Patient states her signs and symptoms are consistent with an identical to symptoms she has had related to carpal tunnel syndrome in the past. 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 She feels she has been overusing this hand.  Past Medical History  Diagnosis Date  . Bronchitis   . Heart murmur   . Carpal tunnel syndrome of right wrist    Past Surgical History  Procedure Laterality Date  . Tubal ligation     No family history on file. History  Substance Use Topics  . Smoking status: Never Smoker   . Smokeless tobacco: Never Used  . Alcohol Use: Yes     Comment: occasional    OB History    Gravida Para Term Preterm AB TAB SAB Ectopic Multiple Living   0 0 0 0 0 4     Review of Systems  Constitutional: Negative for fever.  Eyes: Negative for visual disturbance.  Respiratory: Negative for shortness of breath.   Cardiovascular: Negative for chest pain.  Gastrointestinal: Negative for nausea, vomiting and abdominal pain.  Genitourinary: Negative for dysuria.  Skin: Negative for rash.  Neurological: Negative for dizziness, syncope, facial asymmetry, weakness, numbness and headaches.       Paresthesias  Psychiatric/Behavioral: Negative.       Allergies  Other and Peach flavor  Home Medications   Prior to Admission medications   Medication Sig Start Date End Date  Taking? Authorizing Provider  amoxicillin-clavulanate (AUGMENTIN) 875-125 MG per tablet Take 1 tablet by mouth every 12 (twelve) hours. Patient not taking: Reported on 07/16/2014 07/15/14   Harle Battiest, NP  amoxicillin-clavulanate (AUGMENTIN) 875-125 MG per tablet Take 1 tablet by mouth 2 (two) times daily. One po bid x 7 days Patient not taking: Reported on 01/25/2015 11/11/14   Arby Barrette, MD  ibuprofen (ADVIL,MOTRIN) 800 MG tablet Take 1 tablet (800 mg total) by mouth 3 (three) times daily. 01/25/15   Ladona Mow, PA-C  naproxen (NAPROSYN) 500 MG tablet Take 1 tablet (500 mg total) by mouth 2 (two) times daily. Patient not taking: Reported on 07/16/2014 12/12/13   Azalia Bilis, MD  naproxen (NAPROSYN) 500 MG tablet Take 1 tablet (500 mg total) by mouth 2 (two) times daily. Patient not taking: Reported on 11/11/2014 07/15/14   Harle Battiest, NP  Norgestimate-Ethinyl Estradiol Triphasic 0.18/0.215/0.25 MG-25 MCG tab Take 1 tablet by mouth daily. Patient not taking: Reported on 07/16/2014 09/03/13   Vale Haven, MD  predniSONE (DELTASONE) 20 MG tablet 2 tabs po daily x 4 days Patient not taking: Reported on 01/25/2015 11/11/14   Arby Barrette, MD   BP 116/81 mmHg  Pulse 75  Temp(Src) 98 F (36.7 C) (Oral)  Resp 20  Ht  (1.753 m)  Wt 175 lb (79.379 kg)  BMI 25.83 kg/m2  SpO2 99%  LMP 01/19/2015 (Approximate) Physical Exam  Constitutional: She is oriented to person, place, and  time. She appears well-developed and well-nourished. No distress.  HENT:  Head: Normocephalic and atraumatic.  Eyes: Right eye exhibits no discharge. Left eye exhibits no discharge. No scleral icterus.  Neck: Normal range of motion and full passive range of motion without pain. Neck supple. No spinous process tenderness and no muscular tenderness present. No rigidity. No edema, no erythema and normal range of motion present. No Brudzinski's sign and no Kernig's sign noted.  Pulmonary/Chest: Effort normal. No  respiratory distress.  Musculoskeletal: Normal range of motion.  Right arm exam: Full active and passive range of motion of shoulder, elbow, wrist. Motor strength 5 out of 5 at shoulder, elbow, wrist, grip. Radial pulse 2+ bilaterally. Capillary refill less than 2 seconds. Distal sensation intact. Sensation throughout patient's arm intact following different dermatomes. Tinnel's sign positive on the right.  Neurological: She is alert and oriented to person, place, and time. She has normal strength. No cranial nerve deficit or sensory deficit. She displays a negative Romberg sign. Coordination and gait normal. GCS eye subscore is 4. GCS verbal subscore is 5. GCS motor subscore is 6.  Reflex Scores:      Tricep reflexes are 2+ on the right side and 2+ on the left side.      Bicep reflexes are 2+ on the right side and 2+ on the left side.      Brachioradialis reflexes are 2+ on the right side and 2+ on the left side. Patient fully alert, answering questions appropriately in full, clear sentences. Cranial nerves II through XII grossly intact. Motor strength 5 out of 5 in all major muscle groups of upper and lower extremities. Distal sensation intact.   Skin: Skin is warm and dry. She is not diaphoretic.  Psychiatric: She has a normal mood and affect.  Nursing note and vitals reviewed.   ED Course  Procedures (including critical care time) Labs Review Labs Reviewed - No data to display  Imaging Review No results found.   EKG Interpretation None      MDM   Final diagnoses:  Carpal tunnel syndrome of right wrist    Patient here with signs and symptoms consistent with carpal tunnel syndrome. 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 Patient does have some vague paresthesias in her arm, however they radiate proximally. There is no  neck pain or reproduction of pain with range of motion of neck. No concern for radiculopathy. No concern for CVA. Patient neurologically intact otherwise. Positive Tinel's sign on the right, indicative of recurrent carpal tunnel syndrome. Patient hemodynamically stable and in no acute distress. Patient stable for discharge. Encouraged conservative therapy with splint, RICE therapy and follow-up with hand surgery. Return precautions discussed, patient verbalizes understanding and agreement of this plan.  BP 116/81 mmHg  Pulse 75  Temp(Src) 98 F (36.7 C) (Oral)  Resp 20  Ht 5\' 9"  (1.753 m)  Wt 175 lb (79.379 kg)  BMI 25.83 kg/m2  SpO2 99%  LMP 01/19/2015 (Approximate)  Signed,  Ladona MowJoe Tiannah Greenly, PA-C 9:35 AM   Ladona MowJoe Juancarlos Crescenzo, PA-C 01/25/15 [id number]  Zadie Rhineonald Wickline, MD 01/25/15 1020

## 2015-04-08 ENCOUNTER — Encounter (HOSPITAL_COMMUNITY): Payer: Self-pay | Admitting: *Deleted

## 2015-04-08 ENCOUNTER — Emergency Department (HOSPITAL_COMMUNITY)
Admission: EM | Admit: 2015-04-08 | Discharge: 2015-04-09 | Disposition: A | Discharge status: Home / Self Care | Payer: Medicaid Other | Attending: Emergency Medicine | Admitting: Emergency Medicine

## 2015-04-08 DIAGNOSIS — J111 Influenza due to unidentified influenza virus with other respiratory manifestations: Secondary | ICD-10-CM | POA: Insufficient documentation

## 2015-04-08 DIAGNOSIS — Z8669 Personal history of other diseases of the nervous system and sense organs: Secondary | ICD-10-CM | POA: Diagnosis not present

## 2015-04-08 DIAGNOSIS — R509 Fever, unspecified: Secondary | ICD-10-CM | POA: Diagnosis present

## 2015-04-08 DIAGNOSIS — Z3202 Encounter for pregnancy test, result negative: Secondary | ICD-10-CM | POA: Diagnosis not present

## 2015-04-08 DIAGNOSIS — R69 Illness, unspecified: Secondary | ICD-10-CM

## 2015-04-08 DIAGNOSIS — R52 Pain, unspecified: Secondary | ICD-10-CM | POA: Diagnosis not present

## 2015-04-08 DIAGNOSIS — R011 Cardiac murmur, unspecified: Secondary | ICD-10-CM | POA: Diagnosis not present

## 2015-04-08 LAB — CBC WITH DIFFERENTIAL/PLATELET
Basophils Absolute: 0 10*3/uL (ref 0.0–0.1)
Basophils Relative: 0 %
EOS ABS: 0.1 10*3/uL (ref 0.0–0.7)
Eosinophils Relative: 1 %
HCT: 33.3 % — ABNORMAL LOW (ref 36.0–46.0)
HEMOGLOBIN: 11 g/dL — AB (ref 12.0–15.0)
LYMPHS ABS: 0.6 10*3/uL — AB (ref 0.7–4.0)
LYMPHS PCT: 10 %
MCH: 26.4 pg (ref 26.0–34.0)
MCHC: 33 g/dL (ref 30.0–36.0)
MCV: 80 fL (ref 78.0–100.0)
Monocytes Absolute: 0.4 10*3/uL (ref 0.1–1.0)
Monocytes Relative: 6 %
Neutro Abs: 5.6 10*3/uL (ref 1.7–7.7)
Neutrophils Relative %: 83 %
Platelets: 262 10*3/uL (ref 150–400)
RBC: 4.16 MIL/uL (ref 3.87–5.11)
RDW: 14.5 % (ref 11.5–15.5)
WBC: 6.7 10*3/uL (ref 4.0–10.5)

## 2015-04-08 LAB — URINE MICROSCOPIC-ADD ON

## 2015-04-08 LAB — BASIC METABOLIC PANEL
Anion gap: 11 (ref 5–15)
CHLORIDE: 103 mmol/L (ref 101–111)
CO2: 20 mmol/L — ABNORMAL LOW (ref 22–32)
Calcium: 8.8 mg/dL — ABNORMAL LOW (ref 8.9–10.3)
Creatinine, Ser: 0.74 mg/dL (ref 0.44–1.00)
GFR calc non Af Amer: >60 mL/min (ref >60)
Glucose, Bld: 99 mg/dL (ref 65–99)
Potassium: 3.5 mmol/L (ref 3.5–5.1)
SODIUM: 134 mmol/L — AB (ref 135–145)

## 2015-04-08 LAB — URINALYSIS, ROUTINE W REFLEX MICROSCOPIC
Bilirubin Urine: NEGATIVE
Glucose, UA: NEGATIVE mg/dL
Ketones, ur: NEGATIVE mg/dL
Leukocytes, UA: NEGATIVE
Nitrite: NEGATIVE
Protein, ur: NEGATIVE mg/dL
Specific Gravity, Urine: 1.015 (ref 1.005–1.030)
Urobilinogen, UA: 0.2 mg/dL (ref 0.0–1.0)
pH: 8 (ref 5.0–8.0)

## 2015-04-08 LAB — POC URINE PREG, ED: Preg Test, Ur: NEGATIVE

## 2015-04-08 LAB — I-STAT BETA HCG BLOOD, ED (MC, WL, AP ONLY)

## 2015-04-08 LAB — MONONUCLEOSIS SCREEN: MONO SCREEN: NEGATIVE

## 2015-04-08 LAB — I-STAT CG4 LACTIC ACID, ED: Lactic Acid, Venous: 1.01 mmol/L (ref 0.5–2.0)

## 2015-04-08 MED ORDER — ACETAMINOPHEN 325 MG PO TABS
650.0000 mg | ORAL_TABLET | Freq: Four times a day (QID) | ORAL | Status: DC | PRN
  Administered 2015-04-08: 650 mg via ORAL

## 2015-04-08 MED ORDER — OXYCODONE HCL 5 MG PO TABS: 2.5000 mg | ORAL_TABLET | Freq: Four times a day (QID) | ORAL | Status: AC | PRN

## 2015-04-08 MED ORDER — PROMETHAZINE HCL 25 MG PO TABS: 25.0000 mg | ORAL_TABLET | Freq: Four times a day (QID) | ORAL | Status: AC | PRN

## 2015-04-08 MED ORDER — ACETAMINOPHEN 325 MG PO TABS
ORAL_TABLET | ORAL | Status: AC
  Filled 2015-04-08: qty 2

## 2015-04-08 MED ORDER — SODIUM CHLORIDE 0.9 % IV BOLUS (SEPSIS)
2000.0000 mL | Freq: Once | INTRAVENOUS | Status: AC
Start: 2015-04-08 — End: 2015-04-09
  Administered 2015-04-08: 2000 mL via INTRAVENOUS

## 2015-04-08 NOTE — Discharge Instructions (Signed)
Take acetaminophen (Tylenol) up to 650 mg (this is normally 2 over-the-counter pills) up to every 4-6 hours to control the fever. Do not drink alcohol. Make sure your other medications do not contain acetaminophen (Read the labels!)  Return to the emergency room for any worsening or concerning symptoms including fast breathing, heart racing, confusion, vomiting.  Rest, cover your mouth when you cough and wash your hands frequently.   Push fluids: water or Gatorade, do not drink any soda, juice or caffeinated beverages.  Do not return to work until a day after your fever breaks.   Please follow with your primary care doctor in the next 2 days for a check-up. They must obtain records for further management.

## 2015-04-08 NOTE — ED Notes (Signed)
Patient presents with c/o generalized body aches and fever at home.  Has not taken any Tylenol or Motrin

## 2015-04-08 NOTE — ED Provider Notes (Signed)
CSN: [id number]     Arrival date & time 04/08/15  2013 History   First MD Initiated Contact with Patient 04/08/15 2149     Chief Complaint  Patient presents with  . Generalized Body Aches  . Fever     (Consider location/radiation/quality/duration/timing/severity/associated sxs/prior Treatment) HPI   Blood pressure 144/85, pulse 116, temperature 98.6 F (37 C), temperature source Oral, resp. rate 16, height  (1.626 m), weight 195 lb 14.4 oz (88.86 kg), last menstrual period 04/04/2015, SpO2 100 %.  Ashley Brown is a 29 y.o. female complaining of onset of rhinorrhea, sneezing, headache, sore throat, dry cough, fever with MAXIMUM TEMPERATURE of 101.2, diffuse myalgia with several episodes of nonbloody, nonbilious, coffee-ground emesis starting this morning. Patient denies chest pain, shortness of breath, focal abdominal pain, change in urination, change in bowel habits, cervicalgia, rash, recent travel, recent exposure to tick bite. She has a positive sick contact her boyfriend is sick with similar. No flu shot this year. She is otherwise healthy.  Past Medical History  Diagnosis Date  . Bronchitis   . Heart murmur   . Carpal tunnel syndrome of right wrist    Past Surgical History  Procedure Laterality Date  . Tubal ligation     No family history on file. Social History  Substance Use Topics  . Smoking status: Never Smoker   . Smokeless tobacco: Never Used  . Alcohol Use: Yes     Comment: occasional    OB History    Gravida Para Term Preterm AB TAB SAB Ectopic Multiple Living   0 0 0 0 0 4     Review of Systems  10 systems reviewed and found to be negative, except as noted in the HPI.   Allergies  Other and Peach flavor  Home Medications   Prior to Admission medications   Medication Sig Start Date End Date Taking? Authorizing Provider  amoxicillin-clavulanate (AUGMENTIN) 875-125 MG per tablet Take 1 tablet by mouth every 12 (twelve) hours. Patient not  taking: Reported on 07/16/2014 07/15/14   Harle Battiest, NP  amoxicillin-clavulanate (AUGMENTIN) 875-125 MG per tablet Take 1 tablet by mouth 2 (two) times daily. One po bid x 7 days Patient not taking: Reported on 01/25/2015 11/11/14   Arby Barrette, MD  ibuprofen (ADVIL,MOTRIN) 800 MG tablet Take 1 tablet (800 mg total) by mouth 3 (three) times daily. Patient not taking: Reported on 04/08/2015 01/25/15   Ladona Mow, PA-C  naproxen (NAPROSYN) 500 MG tablet Take 1 tablet (500 mg total) by mouth 2 (two) times daily. Patient not taking: Reported on 07/16/2014 12/12/13   Azalia Bilis, MD  naproxen (NAPROSYN) 500 MG tablet Take 1 tablet (500 mg total) by mouth 2 (two) times daily. Patient not taking: Reported on 11/11/2014 07/15/14   Harle Battiest, NP  Norgestimate-Ethinyl Estradiol Triphasic 0.18/0.215/0.25 MG-25 MCG tab Take 1 tablet by mouth daily. Patient not taking: Reported on 07/16/2014 09/03/13   Vale Haven, MD  predniSONE (DELTASONE) 20 MG tablet 2 tabs po daily x 4 days Patient not taking: Reported on 01/25/2015 11/11/14   Arby Barrette, MD   BP 144/85 mmHg  Pulse 116  Temp(Src) 98.6 F (37 C) (Oral)  Resp 16  Ht  (1.626 m)  Wt 195 lb 14.4 oz (88.86 kg)  BMI 33.61 kg/m2  SpO2 100%  LMP 04/04/2015 Physical Exam  Constitutional: She is oriented to person, place, and time. She appears well-developed and well-nourished. No distress.  HENT:  Head: Normocephalic and atraumatic.  Nose: Rhinorrhea present.  Mouth/Throat: Oropharynx is clear and moist.  3+ tonsillar hypertrophy with exudate on the left side, uvula is midline, soft palate rises symmetrically.  Eyes: Conjunctivae and EOM are normal. Pupils are equal, round, and reactive to light.  Neck: Normal range of motion.  Cardiovascular: Normal rate, regular rhythm and intact distal pulses.   Pulmonary/Chest: Effort normal and breath sounds normal. No stridor. No respiratory distress. She has no wheezes. She has no rales. She exhibits no  tenderness.  Abdominal: Soft. There is no tenderness.  Musculoskeletal: Normal range of motion.  Neurological: She is alert and oriented to person, place, and time.  Skin: She is not diaphoretic.  Psychiatric: She has a normal mood and affect.  Nursing note and vitals reviewed.   ED Course  Procedures (including critical care time) Labs Review Labs Reviewed  URINALYSIS, ROUTINE W REFLEX MICROSCOPIC (NOT AT [facility]) - Abnormal; Notable for the following:    Hgb urine dipstick SMALL (*)    All other components within normal limits  URINE MICROSCOPIC-ADD ON  CBC WITH DIFFERENTIAL/PLATELET  BASIC METABOLIC PANEL  POC URINE PREG, ED  I-STAT BETA HCG BLOOD, ED ([facility], [facility], [facility] ONLY)  I-STAT CG4 LACTIC ACID, ED    Imaging Review No results found. I have personally reviewed and evaluated these images and lab results as part of my medical decision-making.   EKG Interpretation None      MDM   Final diagnoses:  Influenza-like illness   Filed Vitals:   04/08/15 2215 04/08/15 2331 04/08/15 2345 04/08/15 2351  BP: 101/66  105/62 103/62  Pulse: 97  101 104  Temp: 100.1 F (37.8 C) 99 F (37.2 C)  98.1 F (36.7 C)  TempSrc: Oral Oral  Oral  Resp: 16   16  Height:      Weight:      SpO2: 100%  100% 98%    Medications  acetaminophen (TYLENOL) tablet 650 mg (650 mg Oral Given 04/08/15 2037)  sodium chloride 0.9 % bolus 2,000 mL (2,000 mLs Intravenous New Bag/Given 04/08/15 2219)    Ashley Brown is a pleasant 29 y.o. female presenting with fever myalgia, sore throat, dry cough, runny nose. Lung sounds are clear to auscultation, patient saturating well on room air. Patient denies chest pain or shortness of breath. I doubt this is a pneumonia. Patient is initially mildly tachycardic and febrile. However, she has a normal lactic acid, no significant leukocytosis. Urinalysis without signs of infection. Strep and mono are negative. Likely influenza. Pros and cons of Tamiflu are discussed  declines. We've had an extensive discussion on how to aggressively control fever, push fluids and extensive discussion of return precautions. Patient verbalizes her understanding.  Evaluation does not show pathology that would require ongoing emergent intervention or inpatient treatment. Pt is hemodynamically stable and mentating appropriately. Discussed findings and plan with patient/guardian, who agrees with care plan. All questions answered. Return precautions discussed and outpatient follow up given.   New Prescriptions   OXYCODONE (ROXICODONE) 5 MG IMMEDIATE RELEASE TABLET    Take 0.5-1 tablets (2.5-5 mg total) by mouth every 6 (six) hours as needed.   PROMETHAZINE (PHENERGAN) 25 MG TABLET    Take 1 tablet (25 mg total) by mouth every 6 (six) hours as needed for nausea or vomiting.        Wynetta Emery, PA-C 04/08/15 2358  Raeford Razor, MD 04/09/15 1540

## 2015-04-09 LAB — RAPID STREP SCREEN (MED CTR MEBANE ONLY): STREPTOCOCCUS, GROUP A SCREEN (DIRECT): NEGATIVE

## 2015-04-11 LAB — CULTURE, GROUP A STREP: Strep A Culture: NEGATIVE

## 2015-06-02 ENCOUNTER — Encounter (HOSPITAL_COMMUNITY): Payer: Self-pay | Admitting: Emergency Medicine

## 2015-06-02 ENCOUNTER — Emergency Department (HOSPITAL_COMMUNITY): Payer: Medicaid Other

## 2015-06-02 ENCOUNTER — Emergency Department (HOSPITAL_COMMUNITY)
Admission: EM | Admit: 2015-06-02 | Discharge: 2015-06-02 | Disposition: A | Discharge status: Home / Self Care | Payer: Medicaid Other | Attending: Emergency Medicine | Admitting: Emergency Medicine

## 2015-06-02 DIAGNOSIS — Z8709 Personal history of other diseases of the respiratory system: Secondary | ICD-10-CM | POA: Insufficient documentation

## 2015-06-02 DIAGNOSIS — R079 Chest pain, unspecified: Secondary | ICD-10-CM | POA: Diagnosis present

## 2015-06-02 DIAGNOSIS — R42 Dizziness and giddiness: Secondary | ICD-10-CM | POA: Insufficient documentation

## 2015-06-02 DIAGNOSIS — Z8669 Personal history of other diseases of the nervous system and sense organs: Secondary | ICD-10-CM | POA: Insufficient documentation

## 2015-06-02 DIAGNOSIS — R011 Cardiac murmur, unspecified: Secondary | ICD-10-CM | POA: Insufficient documentation

## 2015-06-02 DIAGNOSIS — R0789 Other chest pain: Secondary | ICD-10-CM | POA: Diagnosis not present

## 2015-06-02 LAB — CBC
HCT: 32.3 % — ABNORMAL LOW (ref 36.0–46.0)
Hemoglobin: 10.4 g/dL — ABNORMAL LOW (ref 12.0–15.0)
MCH: 26.8 pg (ref 26.0–34.0)
MCHC: 32.2 g/dL (ref 30.0–36.0)
MCV: 83.2 fL (ref 78.0–100.0)
PLATELETS: 289 10*3/uL (ref 150–400)
RBC: 3.88 MIL/uL (ref 3.87–5.11)
RDW: 15.5 % (ref 11.5–15.5)
WBC: 4.8 10*3/uL (ref 4.0–10.5)

## 2015-06-02 LAB — BASIC METABOLIC PANEL
Anion gap: 7 (ref 5–15)
BUN: 11 mg/dL (ref 6–20)
CALCIUM: 8.9 mg/dL (ref 8.9–10.3)
CO2: 26 mmol/L (ref 22–32)
CREATININE: 0.7 mg/dL (ref 0.44–1.00)
Chloride: 107 mmol/L (ref 101–111)
GFR calc Af Amer: >60 mL/min (ref >60)
GLUCOSE: 89 mg/dL (ref 65–99)
Potassium: 3.9 mmol/L (ref 3.5–5.1)
SODIUM: 140 mmol/L (ref 135–145)

## 2015-06-02 MED ORDER — IBUPROFEN 800 MG PO TABS
800.0000 mg | ORAL_TABLET | Freq: Once | ORAL | Status: AC
  Administered 2015-06-02: 800 mg via ORAL
  Filled 2015-06-02: qty 1

## 2015-06-02 MED ORDER — IBUPROFEN 800 MG PO TABS: 800.0000 mg | ORAL_TABLET | Freq: Three times a day (TID) | ORAL | Status: AC

## 2015-06-02 NOTE — ED Notes (Addendum)
Patient transported to CT 

## 2015-06-02 NOTE — ED Notes (Signed)
Pt ambulated to RR unassisted 

## 2015-06-02 NOTE — Discharge Instructions (Signed)
Take your medications as prescribed. Follow-up with your primary care provider in 4-5 days. Please return to the Emergency Department if symptoms worsen or new onset of fever, difficulty breathing, weakness.

## 2015-06-02 NOTE — ED Notes (Signed)
Unable to collect labs at this time patient went to xray 

## 2015-06-02 NOTE — ED Provider Notes (Signed)
CSN: [id number]     Arrival date & time 06/02/15  1117 History   First MD Initiated Contact with Patient 06/02/15 1209     Chief Complaint  Patient presents with  . Chest Pain     (Consider location/radiation/quality/duration/timing/severity/associated sxs/prior Treatment) HPI   Patient's a 29 year old female who presents the ED with complaint of chest pain, onset 11 AM. Patient reports while she was at work this afternoon she started having sharp midsternal chest pain, no radiation. 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 She states pain is worsened with movement or deep breathing. Denies any trauma or injury. She also reports having intermittent lightheadedness that is worse with standing and notes she has not had anything to eat or drink today. Denies fever, chills, headache, visual changes, cough, SOB, palpitations, abdominal pain, nausea, vomiting, numbness, tingling, weakness. Patient reports being told she had a heart murmur, cardiac history otherwise unremarkable.  Past Medical History  Diagnosis Date  . Bronchitis   . Heart murmur   . Carpal tunnel syndrome of right wrist    Past Surgical History  Procedure Laterality Date  . Tubal ligation     No family history on file. Social History  Substance Use Topics  . Smoking status: Never Smoker   . Smokeless tobacco: Never Used  . Alcohol Use: Yes     Comment: occasional    OB History    Gravida Para Term Preterm AB TAB SAB Ectopic Multiple Living   0 0 0 0 0 4     Review of Systems  Cardiovascular: Positive for chest pain.  Neurological: Positive for light-headedness.  All other systems reviewed and are negative.     Allergies  Other and Peach flavor  Home Medications   Prior to Admission medications   Medication Sig Start Date End Date Taking? Authorizing Provider  amoxicillin-clavulanate (AUGMENTIN) 875-125 MG per tablet Take 1 tablet by mouth every 12 (twelve) hours. Patient not taking: Reported on  07/16/2014 07/15/14   Harle Battiest, NP  amoxicillin-clavulanate (AUGMENTIN) 875-125 MG per tablet Take 1 tablet by mouth 2 (two) times daily. One po bid x 7 days Patient not taking: Reported on 01/25/2015 11/11/14   Arby Barrette, MD  ibuprofen (ADVIL,MOTRIN) 800 MG tablet Take 1 tablet (800 mg total) by mouth 3 (three) times daily. Patient not taking: Reported on 04/08/2015 01/25/15   Ladona Mow, PA-C  naproxen (NAPROSYN) 500 MG tablet Take 1 tablet (500 mg total) by mouth 2 (two) times daily. Patient not taking: Reported on 07/16/2014 12/12/13   Azalia Bilis, MD  naproxen (NAPROSYN) 500 MG tablet Take 1 tablet (500 mg total) by mouth 2 (two) times daily. Patient not taking: Reported on 11/11/2014 07/15/14   Harle Battiest, NP  Norgestimate-Ethinyl Estradiol Triphasic 0.18/0.215/0.25 MG-25 MCG tab Take 1 tablet by mouth daily. Patient not taking: Reported on 07/16/2014 09/03/13   Vale Haven, MD  oxyCODONE (ROXICODONE) 5 MG immediate release tablet Take 0.5-1 tablets (2.5-5 mg total) by mouth every 6 (six) hours as needed. Patient not taking: Reported on 06/02/2015 04/08/15   Joni Reining Pisciotta, PA-C  predniSONE (DELTASONE) 20 MG tablet 2 tabs po daily x 4 days Patient not taking: Reported on 01/25/2015 11/11/14   Arby Barrette, MD  promethazine (PHENERGAN) 25 MG tablet Take 1 tablet (25 mg total) by mouth every 6 (six) hours as needed for nausea or vomiting. Patient not taking: Reported on 06/02/2015 04/08/15   Joni Reining Pisciotta, PA-C   BP 107/60 mmHg  Pulse 75  Temp(Src) 97.8 F (36.6 C) (Oral)  Resp 17  SpO2 100%  LMP 05/26/2015 Physical Exam  Constitutional: She is oriented to person, place, and time. She appears well-developed and well-nourished. No distress.  HENT:  Head: Normocephalic and atraumatic.  Right Ear: Tympanic membrane normal.  Left Ear: Tympanic membrane normal.  Mouth/Throat: Uvula is midline, oropharynx is clear and moist and mucous membranes are normal. No oropharyngeal exudate.   Eyes: Conjunctivae and EOM are normal. Pupils are equal, round, and reactive to light. Right eye exhibits no discharge. Left eye exhibits no discharge. No scleral icterus.  Neck: Normal range of motion. Neck supple.  Cardiovascular: Normal rate, regular rhythm, normal heart sounds and intact distal pulses.   No murmur heard. Pulmonary/Chest: Effort normal and breath sounds normal. No respiratory distress. She has no wheezes. She has no rales. She exhibits tenderness (diffuse chest wall tenderness with light palpation).  Abdominal: Soft. Bowel sounds are normal. She exhibits no distension and no mass. There is no tenderness. There is no rebound and no guarding.  Musculoskeletal: Normal range of motion. She exhibits no edema or tenderness.  Lymphadenopathy:    She has no cervical adenopathy.  Neurological: She is alert and oriented to person, place, and time. No cranial nerve deficit. Coordination normal.  Patient able to stand and ambulate without assistance, no ataxia.  Skin: Skin is warm and dry. She is not diaphoretic.  Nursing note and vitals reviewed.   ED Course  Procedures (including critical care time) Labs Review Labs Reviewed  CBC - Abnormal; Notable for the following:    Hemoglobin 10.4 (*)    HCT 32.3 (*)    All other components within normal limits  BASIC METABOLIC PANEL    Imaging Review Dg Chest 2 View  06/02/2015  CLINICAL DATA:  29 year old female with 1 day history of chest pain. History of hypertension and asthma. EXAM: CHEST  2 VIEW COMPARISON:  Chest x-ray 12/11/2013. FINDINGS: Lung volumes are normal. No consolidative airspace disease. No pleural effusions. No pneumothorax. No pulmonary nodule or mass noted. Pulmonary vasculature and the cardiomediastinal silhouette are within normal limits. IMPRESSION: No radiographic evidence of acute cardiopulmonary disease. Electronically Signed   By: Trudie Reed M.D.   On: 06/02/2015 12:19   I have personally reviewed  and evaluated these images and lab results as part of my medical decision-making.   EKG Interpretation   Date/Time:  Monday June 02 2015 11:26:52 EST Ventricular Rate:  84 PR Interval:  140 QRS Duration: 81 QT Interval:  352 QTC Calculation: 416 R Axis:   68 Text Interpretation:  Sinus rhythm Borderline T abnormalities, diffuse  leads Baseline wander in lead(s) V5 No significant change was found  Confirmed by CAMPOS  MD, KEVIN ([id number]) on 06/02/2015 1:30:29 PM      MDM   Final diagnoses:  Chest wall pain    Patient presents with midsternal sharp chest pain, worse with movement or deep breathing. Endorses intermittent lightheadedness. Denies any other symptoms. VSS. Exam revealed diffuse chest wall tenderness with light palpation, remaining exam unremarkable. No neuro deficits. Patient able to stand and ambulate without assistance. EKG showed sinus rhythm, no significant change. Chest x-ray revealed no acute cardiopulmonary disease. Labs unremarkable. Presentation is consistent with costochondritis/chest wall pain. Patient given NSAIDs in the ED. I have a low suspicion for ACS, PE, dissection, or other acute cardiac event at this time. Discussed results and plan for discharge with patient. Patient advised to follow up with primary  care provider.  Evaluation does not show pathology requring ongoing emergent intervention or admission. Pt is hemodynamically stable and mentating appropriately. Discussed findings/results and plan with patient/guardian, who agrees with plan. All questions answered. Return precautions discussed and outpatient follow up given.        Satira Sarkicole Elizabeth Harveys [place], [place]C 06/02/15 1404  Azalia BilisKevin Campos, MD 06/03/15 0730

## 2015-06-02 NOTE — ED Notes (Signed)
Pt states she was at work when about an hour ago, she began having central CP with lightheadedness.  Denies NVD.  Denies SOB.

## 2016-01-30 ENCOUNTER — Encounter (HOSPITAL_COMMUNITY): Payer: Self-pay | Admitting: Emergency Medicine

## 2016-01-30 ENCOUNTER — Emergency Department (HOSPITAL_COMMUNITY): Payer: Medicaid Other

## 2016-01-30 ENCOUNTER — Emergency Department (HOSPITAL_COMMUNITY)
Admission: EM | Admit: 2016-01-30 | Discharge: 2016-01-30 | Disposition: A | Discharge status: Home / Self Care | Payer: Medicaid Other | Attending: Emergency Medicine | Admitting: Emergency Medicine

## 2016-01-30 DIAGNOSIS — Y9389 Activity, other specified: Secondary | ICD-10-CM | POA: Diagnosis not present

## 2016-01-30 DIAGNOSIS — Y999 Unspecified external cause status: Secondary | ICD-10-CM | POA: Insufficient documentation

## 2016-01-30 DIAGNOSIS — X509XXA Other and unspecified overexertion or strenuous movements or postures, initial encounter: Secondary | ICD-10-CM | POA: Insufficient documentation

## 2016-01-30 DIAGNOSIS — S99911A Unspecified injury of right ankle, initial encounter: Secondary | ICD-10-CM | POA: Diagnosis present

## 2016-01-30 DIAGNOSIS — Y929 Unspecified place or not applicable: Secondary | ICD-10-CM | POA: Diagnosis not present

## 2016-01-30 DIAGNOSIS — S93401A Sprain of unspecified ligament of right ankle, initial encounter: Secondary | ICD-10-CM | POA: Insufficient documentation

## 2016-01-30 MED ORDER — IBUPROFEN 800 MG PO TABS: 800.0000 mg | ORAL_TABLET | Freq: Three times a day (TID) | ORAL | Status: AC

## 2016-01-30 MED ORDER — IBUPROFEN 400 MG PO TABS
800.0000 mg | ORAL_TABLET | Freq: Once | ORAL | Status: AC
  Administered 2016-01-30: 800 mg via ORAL
  Filled 2016-01-30: qty 2

## 2016-01-30 MED ORDER — ACETAMINOPHEN 325 MG PO TABS
650.0000 mg | ORAL_TABLET | Freq: Four times a day (QID) | ORAL | Status: AC | PRN
Start: 2016-01-30 — End: ?

## 2016-01-30 NOTE — ED Notes (Signed)
Patient came in with her own set of crutches

## 2016-01-30 NOTE — Discharge Instructions (Signed)
Your x-ray was normal. You likely sprained your ankle. I will give you a couple prescriptions to help with your pain. Keep icing your ankle on and off for the next 48 hours. Follow up with Dr. Luiz BlareGraves for orthopedic evaluation. Return to the ER for new or worsening symptoms.   Ankle Sprain An ankle sprain is an injury to the strong, fibrous tissues (ligaments) that hold the bones of your ankle joint together.  CAUSES An ankle sprain is usually caused by a fall or by twisting your ankle. Ankle sprains most commonly occur when you step on the outer edge of your foot, and your ankle turns inward. People who participate in sports are more prone to these types of injuries.  SYMPTOMS   Pain in your ankle. The pain may be present at rest or only when you are trying to stand or walk.  Swelling.  Bruising. Bruising may develop immediately or within 1 to 2 days after your injury.  Difficulty standing or walking, particularly when turning corners or changing directions. DIAGNOSIS  Your caregiver will ask you details about your injury and perform a physical exam of your ankle to determine if you have an ankle sprain. During the physical exam, your caregiver will press on and apply pressure to specific areas of your foot and ankle. Your caregiver will try to move your ankle in certain ways. An X-ray exam may be done to be sure a bone was not broken or a ligament did not separate from one of the bones in your ankle (avulsion fracture).  TREATMENT  Certain types of braces can help stabilize your ankle. Your caregiver can make a recommendation for this. Your caregiver may recommend the use of medicine for pain. If your sprain is severe, your caregiver may refer you to a surgeon who helps to restore function to parts of your skeletal system (orthopedist) or a physical therapist. HOME CARE INSTRUCTIONS   Apply ice to your injury for 1-2 days or as directed by your caregiver. Applying ice helps to reduce  inflammation and pain.  Put ice in a plastic bag.  Place a towel between your skin and the bag.  Leave the ice on for 15-20 minutes at a time, every 2 hours while you are awake.  Only take over-the-counter or prescription medicines for pain, discomfort, or fever as directed by your caregiver.  Elevate your injured ankle above the level of your heart as much as possible for 2-3 days.  If your caregiver recommends crutches, use them as instructed. Gradually put weight on the affected ankle. Continue to use crutches or a cane until you can walk without feeling pain in your ankle.  If you have a plaster splint, wear the splint as directed by your caregiver. Do not rest it on anything harder than a pillow for the first 24 hours. Do not put weight on it. Do not get it wet. You may take it off to take a shower or bath.  You may have been given an elastic bandage to wear around your ankle to provide support. If the elastic bandage is too tight (you have numbness or tingling in your foot or your foot becomes cold and blue), adjust the bandage to make it comfortable.  If you have an air splint, you may blow more air into it or let air out to make it more comfortable. You may take your splint off at night and before taking a shower or bath. Wiggle your toes in the splint several  times per day to decrease swelling. SEEK MEDICAL CARE IF:   You have rapidly increasing bruising or swelling.  Your toes feel extremely cold or you lose feeling in your foot.  Your pain is not relieved with medicine. SEEK IMMEDIATE MEDICAL CARE IF:  Your toes are numb or blue.  You have severe pain that is increasing. MAKE SURE YOU:   Understand these instructions.  Will watch your condition.  Will get help right away if you are not doing well or get worse.   This information is not intended to replace advice given to you by your health care provider. Make sure you discuss any questions you have with your health  care provider.   Document Released: 06/28/2005 Document Revised: 07/19/2014 Document Reviewed: 07/10/2011 Elsevier Interactive Patient Education [employer].

## 2016-01-30 NOTE — Progress Notes (Signed)
Orthopedic Tech Progress Note Patient Details:  Spero Curbshlee R Cienfuegos 1986/05/07 [id number]  Ortho Devices Type of Ortho Device: ASO Ortho Device/Splint Interventions: Application   Saul FordyceJennifer C Verity Gilcrest 01/30/2016, 10:13 AM

## 2016-01-30 NOTE — ED Notes (Signed)
Ortho tech paged; awaiting return call 

## 2016-01-30 NOTE — ED Notes (Signed)
Victorino DikeJennifer, Ortho tech will come and see patient in ED

## 2016-01-30 NOTE — ED Provider Notes (Signed)
CSN: [id number]     Arrival date & time 01/30/16  0809 History   First MD Initiated Contact with Patient 01/30/16 [phone]     Chief Complaint  Patient presents with  . Ankle Injury   HPI  Ashley Brown is an 30 y.o. female who presents to the ED for evaluation of right ankle pain. She states she was horse-playing with her partner last night around midnight and she landed on her right foot at an inverted angle. She states she immediately felt a pop and felt pain. She took  ibuprofen which relieved her pain and allowed her to sleep. She states this AM she notices increased pain and swelling along the lateral malleolar area. Denies numbness, weakness, or tingling. She states it hurts to move her ankle. She states it hurts too much to bear weight.  Past Medical History  Diagnosis Date  . Bronchitis   . Heart murmur   . Carpal tunnel syndrome of right wrist    Past Surgical History  Procedure Laterality Date  . Tubal ligation     No family history on file. Social History  Substance Use Topics  . Smoking status: Never Smoker   . Smokeless tobacco: Never Used  . Alcohol Use: Yes     Comment: occasional    OB History    Gravida Para Term Preterm AB TAB SAB Ectopic Multiple Living   0 0 0 0 0 4     Review of Systems  All other systems reviewed and are negative.     Allergies  Other and Peach flavor  Home Medications   Prior to Admission medications   Medication Sig Start Date End Date Taking? Authorizing Provider  ibuprofen (ADVIL,MOTRIN) 800 MG tablet Take 1 tablet (800 mg total) by mouth 3 (three) times daily. 06/02/15  Yes Satira Sark Nadeau, PA-C  amoxicillin-clavulanate (AUGMENTIN) 875-125 MG per tablet Take 1 tablet by mouth every 12 (twelve) hours. Patient not taking: Reported on 07/16/2014 07/15/14   Harle Battiest, NP  amoxicillin-clavulanate (AUGMENTIN) 875-125 MG per tablet Take 1 tablet by mouth 2 (two) times daily. One po bid x 7 days Patient not  taking: Reported on 01/25/2015 11/11/14   Arby Barrette, MD  naproxen (NAPROSYN) 500 MG tablet Take 1 tablet (500 mg total) by mouth 2 (two) times daily. Patient not taking: Reported on 07/16/2014 12/12/13   Azalia Bilis, MD  naproxen (NAPROSYN) 500 MG tablet Take 1 tablet (500 mg total) by mouth 2 (two) times daily. Patient not taking: Reported on 11/11/2014 07/15/14   Harle Battiest, NP  Norgestimate-Ethinyl Estradiol Triphasic 0.18/0.215/0.25 MG-25 MCG tab Take 1 tablet by mouth daily. Patient not taking: Reported on 07/16/2014 09/03/13   Vale Haven, MD  oxyCODONE (ROXICODONE) 5 MG immediate release tablet Take 0.5-1 tablets (2.5-5 mg total) by mouth every 6 (six) hours as needed. Patient not taking: Reported on 06/02/2015 04/08/15   Joni Reining Pisciotta, PA-C  predniSONE (DELTASONE) 20 MG tablet 2 tabs po daily x 4 days Patient not taking: Reported on 01/25/2015 11/11/14   Arby Barrette, MD  promethazine (PHENERGAN) 25 MG tablet Take 1 tablet (25 mg total) by mouth every 6 (six) hours as needed for nausea or vomiting. Patient not taking: Reported on 06/02/2015 04/08/15   Joni Reining Pisciotta, PA-C   BP 135/89 mmHg  Pulse 101  Temp(Src) 98.2 F (36.8 C) (Oral)  Resp 18  Ht  (1.676 m)  Wt 81.647 kg  BMI 29.07 kg/m2  SpO2  100% Physical Exam  Constitutional: She is oriented to person, place, and time. No distress.  HENT:  Head: Atraumatic.  Right Ear: External ear normal.  Left Ear: External ear normal.  Nose: Nose normal.  Eyes: Conjunctivae are normal. No scleral icterus.  Cardiovascular: Normal rate and regular rhythm.   Pulmonary/Chest: Effort normal. No respiratory distress.  Abdominal: She exhibits no distension.  Musculoskeletal:  Right ankle with edema at lateral malleolus. Lateral malleolus is tender to palpation. No overlying discoloration. Limited ROM of ankle 2/2 pain. 2+ DP and brisk cap refill x 5.  Neurological: She is alert and oriented to person, place, and time.  Skin: Skin is  warm and dry. She is not diaphoretic.  Psychiatric: She has a normal mood and affect. Her behavior is normal.  Nursing note and vitals reviewed.   ED Course  Procedures (including critical care time) Labs Review Labs Reviewed - No data to display  Imaging Review Dg Ankle Complete Right  01/30/2016  CLINICAL DATA:  Status post fall.  Felt a pop in the right ankle. EXAM: RIGHT ANKLE - COMPLETE 3+ VIEW COMPARISON:  None. FINDINGS: There is no evidence of fracture, dislocation, or joint effusion. There is no evidence of arthropathy or other focal bone abnormality. Soft tissues are unremarkable. IMPRESSION: No acute osseous injury of the right ankle. Electronically Signed   By: Elige KoHetal  Patel   On: 01/30/2016 09:07   I have personally reviewed and evaluated these images and lab results as part of my medical decision-making.   EKG Interpretation None      MDM   Final diagnoses:  Right ankle sprain, initial encounter    X-ray negative for acute findings. Suspect ankle sprain. ASO provided. Pt has her own crutches. Encouraged RICE therapy. Rx for ibuprofen/tylenol given. Encouraged ortho f/u. ER return precautions given.     Carlene CoriaSerena Y Traci Gafford, PA-C 01/30/16 1132   Loren Raceravid Yelverton, MD 02/15/16 [phone]

## 2016-01-30 NOTE — ED Notes (Signed)
Small ice pack given

## 2016-01-30 NOTE — ED Notes (Signed)
Victorino DikeJennifer, Ortho tech in with patient at this time

## 2016-01-30 NOTE — ED Notes (Signed)
Patient states "playing around about 12 oclock last night with my fiance and we both fell.  My ankle buckled up underneath me and I heard a pop".   Patient states R ankle pain and swelling.  Patient did take ibuprofen 800 last night and states "it helped me sleep".   Patient denies other symptoms.

## 2016-06-09 ENCOUNTER — Encounter (HOSPITAL_COMMUNITY): Payer: Self-pay | Admitting: Emergency Medicine

## 2016-06-09 ENCOUNTER — Emergency Department (HOSPITAL_COMMUNITY)
Admission: EM | Admit: 2016-06-09 | Discharge: 2016-06-10 | Disposition: A | Discharge status: Home / Self Care | Payer: Medicaid Other | Attending: Emergency Medicine | Admitting: Emergency Medicine

## 2016-06-09 DIAGNOSIS — J45909 Unspecified asthma, uncomplicated: Secondary | ICD-10-CM | POA: Diagnosis not present

## 2016-06-09 DIAGNOSIS — M545 Low back pain, unspecified: Secondary | ICD-10-CM

## 2016-06-09 HISTORY — DX: Unspecified asthma, uncomplicated: J45.909

## 2016-06-09 MED ORDER — KETOROLAC TROMETHAMINE 60 MG/2ML IM SOLN
60.0000 mg | Freq: Once | INTRAMUSCULAR | Status: AC
  Administered 2016-06-10: 60 mg via INTRAMUSCULAR
  Filled 2016-06-09: qty 2

## 2016-06-09 MED ORDER — CYCLOBENZAPRINE HCL 10 MG PO TABS
10.0000 mg | ORAL_TABLET | Freq: Once | ORAL | Status: AC
  Administered 2016-06-10: 10 mg via ORAL
  Filled 2016-06-09: qty 1

## 2016-06-09 NOTE — ED Triage Notes (Addendum)
Pt from home with complaints of back pain x 2 days. Pt states the pain is midway up her back and stretches across her entire back. Pt states she was folding clothes when it started. Pt denies any episodes of incontinence. Pt denies leg involvement. Pt denies urinary symptoms

## 2016-06-09 NOTE — ED Provider Notes (Signed)
[facility]-EMERGENCY DEPT Provider Note   CSN: [id number] Arrival date & time: 06/09/16  2055  By signing my name below, I, Phillis HaggisGabriella Gaje, attest that this documentation has been prepared under the direction and in the presence of Geoffery Lyonsouglas Meghana Tullo, MD. Electronically Signed: Phillis HaggisGabriella Gaje, ED Scribe. 06/09/16. 11:57 PM.  History   Chief Complaint Chief Complaint  Patient presents with  . Back Pain   The history is provided by the patient. No language interpreter was used.   HPI Comments: Ashley Brown is a 30 y.o. female brought in by EMS who presents to the Emergency Department complaining of gradually worsening thoracic back pain onset 2 days ago. The pain has now moved to her lower back. Pt says she was folding clothes when the pain started. Pt has worsening pain with deep breathing, having bowel movements, or movement. Pt reports a hx of back spasms. She denies bladder or bowel incontinence, numbness, weakness, hematuria, dysuria, or leg pain.   Past Medical History:  Diagnosis Date  . Asthma   . Bronchitis   . Carpal tunnel syndrome of right wrist   . Heart murmur     Patient Active Problem List   Diagnosis Date Noted  . Menorrhagia 09/03/2013  . Dysmenorrhea 09/03/2013    Past Surgical History:  Procedure Laterality Date  . TUBAL LIGATION      OB History    Gravida Para Term Preterm AB Living   4 4 1 3  0 4   SAB TAB Ectopic Multiple Live Births   0 0 0 0         Home Medications    Prior to Admission medications   Medication Sig Start Date End Date Taking? Authorizing Provider  acetaminophen (TYLENOL) 325 MG tablet Take 2 tablets (650 mg total) by mouth every 6 (six) hours as needed. 01/30/16   Ace GinsSerena Y Sam, PA-C  amoxicillin-clavulanate (AUGMENTIN) 875-125 MG per tablet Take 1 tablet by mouth every 12 (twelve) hours. Patient not taking: Reported on 07/16/2014 07/15/14   Harle BattiestElizabeth Tysinger, NP  amoxicillin-clavulanate (AUGMENTIN) 875-125 MG per tablet Take 1 tablet by  mouth 2 (two) times daily. One po bid x 7 days Patient not taking: Reported on 01/25/2015 11/11/14   Arby BarretteMarcy Pfeiffer, MD  ibuprofen (ADVIL,MOTRIN) 800 MG tablet Take 1 tablet (800 mg total) by mouth 3 (three) times daily. 06/02/15   Barrett HenleNicole Elizabeth Nadeau, PA-C  ibuprofen (ADVIL,MOTRIN) 800 MG tablet Take 1 tablet (800 mg total) by mouth 3 (three) times daily. 01/30/16   Ace GinsSerena Y Sam, PA-C  naproxen (NAPROSYN) 500 MG tablet Take 1 tablet (500 mg total) by mouth 2 (two) times daily. Patient not taking: Reported on 07/16/2014 12/12/13   Azalia BilisKevin Campos, MD  naproxen (NAPROSYN) 500 MG tablet Take 1 tablet (500 mg total) by mouth 2 (two) times daily. Patient not taking: Reported on 11/11/2014 07/15/14   Harle BattiestElizabeth Tysinger, NP  Norgestimate-Ethinyl Estradiol Triphasic 0.18/0.215/0.25 MG-25 MCG tab Take 1 tablet by mouth daily. Patient not taking: Reported on 07/16/2014 09/03/13   Vale HavenKeli L Beck, MD  oxyCODONE (ROXICODONE) 5 MG immediate release tablet Take 0.5-1 tablets (2.5-5 mg total) by mouth every 6 (six) hours as needed. Patient not taking: Reported on 06/02/2015 04/08/15   Joni ReiningNicole Pisciotta, PA-C  predniSONE (DELTASONE) 20 MG tablet 2 tabs po daily x 4 days Patient not taking: Reported on 01/25/2015 11/11/14   Arby BarretteMarcy Pfeiffer, MD  promethazine (PHENERGAN) 25 MG tablet Take 1 tablet (25 mg total) by mouth every 6 (six) hours as  needed for nausea or vomiting. Patient not taking: Reported on 06/02/2015 04/08/15   Wynetta EmeryNicole Pisciotta, PA-C    Family History No family history on file.  Social History Social History  Substance Use Topics  . Smoking status: Never Smoker  . Smokeless tobacco: Never Used  . Alcohol use Yes     Comment: occasional      Allergies   Other and Peach flavor   Review of Systems Review of Systems  Genitourinary: Negative for dysuria and hematuria.  Musculoskeletal: Positive for back pain.  Neurological: Negative for weakness and numbness.     Physical Exam Updated Vital Signs Ht 5'  4" (1.626 m)   Wt 180 lb (81.6 kg)   BMI 30.90 kg/m   Physical Exam  Constitutional: She is oriented to person, place, and time. She appears well-developed and well-nourished.  HENT:  Head: Normocephalic.  Eyes: EOM are normal.  Neck: Normal range of motion.  Pulmonary/Chest: Effort normal.  Abdominal: She exhibits no distension.  Musculoskeletal: Normal range of motion.  TTP in the soft tissues of the lumbar region. No bony tenderness or step-offs.   Neurological: She is alert and oriented to person, place, and time.  DTRs are trace and symmetrical bilaterally. Strength is 5/5 in both lower extremities  Psychiatric: She has a normal mood and affect.  Nursing note and vitals reviewed.    ED Treatments / Results  COORDINATION OF CARE: 11:54 PM-Discussed treatment plan which includes muscle relaxants with pt at bedside and pt agreed to plan.    Labs (all labs ordered are listed, but only abnormal results are displayed) Labs Reviewed - No data to display  EKG  EKG Interpretation None       Radiology No results found.  Procedures Procedures (including critical care time)  Medications Ordered in ED Medications - No data to display   Initial Impression / Assessment and Plan / ED Course  I have reviewed the triage vital signs and the nursing notes.  Pertinent labs & imaging results that were available during my care of the patient were reviewed by me and considered in my medical decision making (see chart for details).  Clinical Course     Patient presents with complaints of low back pain that seems very musculoskeletal in nature. There are no red flags that would suggest an emergent situation such as bowel or bladder incontinence or a symmetrical reflexes. She was given Toradol and Flexeril and will be discharged with anti-inflammatories and Flexeril. She is to follow-up with her primary Dr. if not improving in the next week.  Final Clinical Impressions(s) / ED  Diagnoses   Final diagnoses:  None  I personally performed the services described in this documentation, which was scribed in my presence. The recorded information has been reviewed and is accurate.      New Prescriptions New Prescriptions   No medications on file     Geoffery Lyonsouglas Avelynn Sellin, MD 06/10/16 [phone]

## 2016-06-10 MED ORDER — CYCLOBENZAPRINE HCL 10 MG PO TABS: 10.0000 mg | ORAL_TABLET | Freq: Three times a day (TID) | ORAL | 0 refills | Status: AC | PRN

## 2016-06-10 NOTE — Discharge Instructions (Signed)
Ibuprofen 600 mg 3 times daily for the next 5 days.  Flexeril as prescribed as needed for pain not relieved with ibuprofen.  Follow-up with your primary Dr. if you're not improving in the next week to discuss referral to physical therapy or possibly imaging studies.

## 2016-06-10 NOTE — ED Notes (Signed)
Pt was able to ambulate to the wheelchair and get dressed without assistance.
